# Patient Record
Sex: Female | Born: 1935 | Race: White | Hispanic: No | Marital: Single | Smoking: Former smoker
Health system: Southern US, Community
[De-identification: ages and names within clinical notes are randomized; demographics above are authoritative.]

## PROBLEM LIST (undated history)

## (undated) DIAGNOSIS — H3530 Unspecified macular degeneration: Secondary | ICD-10-CM

## (undated) DIAGNOSIS — H548 Legal blindness, as defined in USA: Secondary | ICD-10-CM

## (undated) DIAGNOSIS — M5412 Radiculopathy, cervical region: Secondary | ICD-10-CM

## (undated) DIAGNOSIS — J449 Chronic obstructive pulmonary disease, unspecified: Secondary | ICD-10-CM

## (undated) DIAGNOSIS — E1142 Type 2 diabetes mellitus with diabetic polyneuropathy: Secondary | ICD-10-CM

## (undated) DIAGNOSIS — I739 Peripheral vascular disease, unspecified: Secondary | ICD-10-CM

## (undated) DIAGNOSIS — IMO0002 Reserved for concepts with insufficient information to code with codable children: Secondary | ICD-10-CM

## (undated) DIAGNOSIS — C8307 Small cell B-cell lymphoma, spleen: Secondary | ICD-10-CM

## (undated) DIAGNOSIS — N189 Chronic kidney disease, unspecified: Secondary | ICD-10-CM

## (undated) DIAGNOSIS — N318 Other neuromuscular dysfunction of bladder: Secondary | ICD-10-CM

## (undated) DIAGNOSIS — R269 Unspecified abnormalities of gait and mobility: Secondary | ICD-10-CM

## (undated) HISTORY — DX: Small cell b-cell lymphoma, spleen: C83.07

## (undated) HISTORY — DX: Legal blindness, as defined in USA: H54.8

## (undated) HISTORY — DX: Unspecified macular degeneration: H35.30

## (undated) HISTORY — DX: Chronic kidney disease, unspecified: N18.9

## (undated) HISTORY — DX: Type 2 diabetes mellitus with diabetic polyneuropathy: E11.42

## (undated) HISTORY — DX: Other neuromuscular dysfunction of bladder: N31.8

## (undated) HISTORY — DX: Radiculopathy, cervical region: M54.12

## (undated) HISTORY — DX: Unspecified abnormalities of gait and mobility: R26.9

## (undated) HISTORY — DX: Reserved for concepts with insufficient information to code with codable children: IMO0002

## (undated) HISTORY — DX: Peripheral vascular disease, unspecified: I73.9

## (undated) HISTORY — DX: Chronic obstructive pulmonary disease, unspecified: J44.9

## (undated) HISTORY — PX: EYE SURGERY: SHX253

---

## 1998-07-22 ENCOUNTER — Ambulatory Visit (HOSPITAL_COMMUNITY): Admission: RE | Admit: 1998-07-22 | Discharge: 1998-07-22 | Payer: Self-pay | Admitting: *Deleted

## 1999-07-03 ENCOUNTER — Ambulatory Visit (HOSPITAL_COMMUNITY): Admission: RE | Admit: 1999-07-03 | Discharge: 1999-07-03 | Payer: Self-pay | Admitting: *Deleted

## 2000-07-13 ENCOUNTER — Encounter: Admission: RE | Admit: 2000-07-13 | Discharge: 2000-07-13 | Payer: Self-pay | Admitting: Internal Medicine

## 2000-07-13 ENCOUNTER — Encounter: Payer: Self-pay | Admitting: Internal Medicine

## 2000-09-23 ENCOUNTER — Encounter: Payer: Self-pay | Admitting: Orthopedic Surgery

## 2000-09-23 ENCOUNTER — Ambulatory Visit (HOSPITAL_COMMUNITY): Admission: RE | Admit: 2000-09-23 | Discharge: 2000-09-23 | Payer: Self-pay | Admitting: Orthopedic Surgery

## 2000-11-09 ENCOUNTER — Encounter: Admission: RE | Admit: 2000-11-09 | Discharge: 2000-11-09 | Payer: Self-pay | Admitting: Internal Medicine

## 2000-11-09 ENCOUNTER — Encounter: Payer: Self-pay | Admitting: Internal Medicine

## 2001-01-30 ENCOUNTER — Encounter (INDEPENDENT_AMBULATORY_CARE_PROVIDER_SITE_OTHER): Payer: Self-pay

## 2001-01-30 ENCOUNTER — Ambulatory Visit (HOSPITAL_COMMUNITY): Admission: RE | Admit: 2001-01-30 | Discharge: 2001-01-30 | Payer: Self-pay | Admitting: *Deleted

## 2001-07-13 ENCOUNTER — Ambulatory Visit (HOSPITAL_COMMUNITY): Admission: RE | Admit: 2001-07-13 | Discharge: 2001-07-13 | Payer: Self-pay | Admitting: *Deleted

## 2001-11-16 ENCOUNTER — Other Ambulatory Visit: Admission: RE | Admit: 2001-11-16 | Discharge: 2001-11-16 | Payer: Self-pay | Admitting: *Deleted

## 2002-07-04 ENCOUNTER — Encounter: Payer: Self-pay | Admitting: Internal Medicine

## 2002-07-04 ENCOUNTER — Ambulatory Visit (HOSPITAL_COMMUNITY): Admission: RE | Admit: 2002-07-04 | Discharge: 2002-07-04 | Payer: Self-pay | Admitting: Internal Medicine

## 2003-07-09 ENCOUNTER — Encounter: Payer: Self-pay | Admitting: Internal Medicine

## 2003-07-09 ENCOUNTER — Ambulatory Visit (HOSPITAL_COMMUNITY): Admission: RE | Admit: 2003-07-09 | Discharge: 2003-07-09 | Payer: Self-pay | Admitting: Internal Medicine

## 2004-04-07 ENCOUNTER — Ambulatory Visit (HOSPITAL_COMMUNITY): Admission: RE | Admit: 2004-04-07 | Discharge: 2004-04-07 | Payer: Self-pay | Admitting: *Deleted

## 2004-07-10 ENCOUNTER — Ambulatory Visit (HOSPITAL_COMMUNITY): Admission: RE | Admit: 2004-07-10 | Discharge: 2004-07-10 | Payer: Self-pay | Admitting: Internal Medicine

## 2004-09-07 ENCOUNTER — Ambulatory Visit: Payer: Self-pay | Admitting: Oncology

## 2004-10-22 ENCOUNTER — Other Ambulatory Visit: Admission: RE | Admit: 2004-10-22 | Discharge: 2004-10-22 | Payer: Self-pay | Admitting: Oncology

## 2004-10-22 ENCOUNTER — Encounter (INDEPENDENT_AMBULATORY_CARE_PROVIDER_SITE_OTHER): Payer: Self-pay | Admitting: *Deleted

## 2004-10-30 ENCOUNTER — Ambulatory Visit: Payer: Self-pay | Admitting: Oncology

## 2004-12-01 ENCOUNTER — Ambulatory Visit (HOSPITAL_COMMUNITY): Admission: RE | Admit: 2004-12-01 | Discharge: 2004-12-01 | Payer: Self-pay | Admitting: Oncology

## 2005-03-09 ENCOUNTER — Ambulatory Visit: Payer: Self-pay | Admitting: Oncology

## 2005-06-15 ENCOUNTER — Ambulatory Visit: Payer: Self-pay | Admitting: Oncology

## 2005-07-13 ENCOUNTER — Ambulatory Visit (HOSPITAL_COMMUNITY): Admission: RE | Admit: 2005-07-13 | Discharge: 2005-07-13 | Payer: Self-pay | Admitting: Internal Medicine

## 2005-09-10 ENCOUNTER — Ambulatory Visit: Payer: Self-pay | Admitting: Oncology

## 2005-12-13 ENCOUNTER — Ambulatory Visit: Payer: Self-pay | Admitting: Oncology

## 2006-03-11 ENCOUNTER — Ambulatory Visit: Payer: Self-pay | Admitting: Oncology

## 2006-03-15 LAB — COMPREHENSIVE METABOLIC PANEL
AST: 19 U/L (ref 0–37)
Albumin: 4.6 g/dL (ref 3.5–5.2)
Alkaline Phosphatase: 76 U/L (ref 39–117)
BUN: 21 mg/dL (ref 6–23)
Calcium: 9.4 mg/dL (ref 8.4–10.5)
Chloride: 103 mEq/L (ref 96–112)
Glucose, Bld: 109 mg/dL — ABNORMAL HIGH (ref 70–99)
Potassium: 4.7 mEq/L (ref 3.5–5.3)
Sodium: 141 mEq/L (ref 135–145)
Total Protein: 6.7 g/dL (ref 6.0–8.3)

## 2006-03-15 LAB — CBC & DIFF AND RETIC
Eosinophils Absolute: 0 10*3/uL (ref 0.0–0.5)
IRF: 0.42 — ABNORMAL HIGH (ref 0.130–0.330)
MONO#: 0.6 10*3/uL (ref 0.1–0.9)
NEUT#: 1.9 10*3/uL (ref 1.5–6.5)
RBC: 3.71 10*6/uL (ref 3.70–5.32)
RDW: 15.7 % — ABNORMAL HIGH (ref 11.3–14.5)
RETIC #: 95 10*3/uL (ref 19.7–115.1)
Retic %: 2.6 % — ABNORMAL HIGH (ref 0.4–2.3)
WBC: 13.2 10*3/uL — ABNORMAL HIGH (ref 3.9–10.0)
lymph#: 10.6 10*3/uL — ABNORMAL HIGH (ref 0.9–3.3)

## 2006-03-15 LAB — MORPHOLOGY

## 2006-06-13 ENCOUNTER — Ambulatory Visit: Payer: Self-pay | Admitting: Oncology

## 2006-06-15 LAB — CBC WITH DIFFERENTIAL/PLATELET
Basophils Absolute: 0.1 10*3/uL (ref 0.0–0.1)
HCT: 29.6 % — ABNORMAL LOW (ref 34.8–46.6)
HGB: 10 g/dL — ABNORMAL LOW (ref 11.6–15.9)
LYMPH%: 79.9 % — ABNORMAL HIGH (ref 14.0–48.0)
MCH: 27 pg (ref 26.0–34.0)
MONO#: 0.8 10*3/uL (ref 0.1–0.9)
NEUT%: 14.4 % — ABNORMAL LOW (ref 39.6–76.8)
Platelets: 92 10*3/uL — ABNORMAL LOW (ref 145–400)
WBC: 15.4 10*3/uL — ABNORMAL HIGH (ref 3.9–10.0)
lymph#: 12.3 10*3/uL — ABNORMAL HIGH (ref 0.9–3.3)

## 2006-06-15 LAB — MORPHOLOGY

## 2006-07-26 ENCOUNTER — Ambulatory Visit (HOSPITAL_COMMUNITY): Admission: RE | Admit: 2006-07-26 | Discharge: 2006-07-26 | Payer: Self-pay | Admitting: Pediatrics

## 2006-08-08 ENCOUNTER — Ambulatory Visit: Payer: Self-pay | Admitting: Oncology

## 2006-08-10 LAB — CBC WITH DIFFERENTIAL/PLATELET
EOS%: 0.3 % (ref 0.0–7.0)
Eosinophils Absolute: 0 10*3/uL (ref 0.0–0.5)
LYMPH%: 77.8 % — ABNORMAL HIGH (ref 14.0–48.0)
MCH: 27 pg (ref 26.0–34.0)
MCV: 80.8 fL — ABNORMAL LOW (ref 81.0–101.0)
MONO%: 4.5 % (ref 0.0–13.0)
NEUT#: 2.4 10*3/uL (ref 1.5–6.5)
Platelets: 90 10*3/uL — ABNORMAL LOW (ref 145–400)
RBC: 3.61 10*6/uL — ABNORMAL LOW (ref 3.70–5.32)
RDW: 15.5 % — ABNORMAL HIGH (ref 11.3–14.5)

## 2006-08-10 LAB — MORPHOLOGY: PLT EST: DECREASED

## 2006-10-14 ENCOUNTER — Ambulatory Visit: Payer: Self-pay | Admitting: Oncology

## 2006-10-19 LAB — CBC WITH DIFFERENTIAL/PLATELET
BASO%: 0.2 % (ref 0.0–2.0)
EOS%: 0.4 % (ref 0.0–7.0)
LYMPH%: 76.4 % — ABNORMAL HIGH (ref 14.0–48.0)
MCHC: 33.5 g/dL (ref 32.0–36.0)
MCV: 80.5 fL — ABNORMAL LOW (ref 81.0–101.0)
MONO%: 6 % (ref 0.0–13.0)
NEUT#: 2.2 10*3/uL (ref 1.5–6.5)
Platelets: 93 10*3/uL — ABNORMAL LOW (ref 145–400)
RBC: 3.87 10*6/uL (ref 3.70–5.32)
RDW: 15.1 % — ABNORMAL HIGH (ref 11.3–14.5)

## 2006-10-19 LAB — COMPREHENSIVE METABOLIC PANEL
AST: 14 U/L (ref 0–37)
Albumin: 4.8 g/dL (ref 3.5–5.2)
Alkaline Phosphatase: 67 U/L (ref 39–117)
Potassium: 4.6 mEq/L (ref 3.5–5.3)
Sodium: 141 mEq/L (ref 135–145)
Total Protein: 6.9 g/dL (ref 6.0–8.3)

## 2006-10-19 LAB — MORPHOLOGY

## 2007-01-11 ENCOUNTER — Ambulatory Visit: Payer: Self-pay | Admitting: Oncology

## 2007-01-16 LAB — CBC WITH DIFFERENTIAL/PLATELET
BASO%: 0.1 % (ref 0.0–2.0)
LYMPH%: 77.1 % — ABNORMAL HIGH (ref 14.0–48.0)
MCH: 27.1 pg (ref 26.0–34.0)
MCHC: 34 g/dL (ref 32.0–36.0)
MCV: 79.7 fL — ABNORMAL LOW (ref 81.0–101.0)
MONO%: 4.6 % (ref 0.0–13.0)
Platelets: 82 10*3/uL — ABNORMAL LOW (ref 145–400)
RBC: 3.66 10*6/uL — ABNORMAL LOW (ref 3.70–5.32)

## 2007-01-16 LAB — MORPHOLOGY: PLT EST: DECREASED

## 2007-03-10 ENCOUNTER — Ambulatory Visit: Payer: Self-pay | Admitting: Oncology

## 2007-03-14 LAB — CBC WITH DIFFERENTIAL/PLATELET
BASO%: 0 % (ref 0.0–2.0)
HCT: 29.5 % — ABNORMAL LOW (ref 34.8–46.6)
HGB: 10.1 g/dL — ABNORMAL LOW (ref 11.6–15.9)
MCHC: 34.3 g/dL (ref 32.0–36.0)
MONO#: 0.8 10*3/uL (ref 0.1–0.9)
NEUT%: 14.5 % — ABNORMAL LOW (ref 39.6–76.8)
WBC: 16.8 10*3/uL — ABNORMAL HIGH (ref 3.9–10.0)
lymph#: 13.4 10*3/uL — ABNORMAL HIGH (ref 0.9–3.3)

## 2007-03-14 LAB — MORPHOLOGY

## 2007-05-12 ENCOUNTER — Ambulatory Visit: Payer: Self-pay | Admitting: Oncology

## 2007-05-16 LAB — CBC WITH DIFFERENTIAL/PLATELET
BASO%: 0 % (ref 0.0–2.0)
EOS%: 0.5 % (ref 0.0–7.0)
MCH: 26.8 pg (ref 26.0–34.0)
MCV: 78.6 fL — ABNORMAL LOW (ref 81.0–101.0)
MONO%: 6 % (ref 0.0–13.0)
NEUT#: 2.1 10*3/uL (ref 1.5–6.5)
RBC: 3.79 10*6/uL (ref 3.70–5.32)
RDW: 15.8 % — ABNORMAL HIGH (ref 11.3–14.5)

## 2007-05-16 LAB — MORPHOLOGY

## 2007-06-30 ENCOUNTER — Ambulatory Visit: Payer: Self-pay | Admitting: Oncology

## 2007-07-04 LAB — CBC WITH DIFFERENTIAL/PLATELET
Basophils Absolute: 0 10*3/uL (ref 0.0–0.1)
EOS%: 0.3 % (ref 0.0–7.0)
MCH: 26.9 pg (ref 26.0–34.0)
MCV: 78.9 fL — ABNORMAL LOW (ref 81.0–101.0)
MONO%: 7.7 % (ref 0.0–13.0)
RBC: 3.78 10*6/uL (ref 3.70–5.32)
RDW: 15.6 % — ABNORMAL HIGH (ref 11.3–14.5)

## 2007-07-04 LAB — MORPHOLOGY

## 2007-08-01 ENCOUNTER — Ambulatory Visit (HOSPITAL_COMMUNITY): Admission: RE | Admit: 2007-08-01 | Discharge: 2007-08-01 | Payer: Self-pay | Admitting: Internal Medicine

## 2007-11-03 ENCOUNTER — Ambulatory Visit: Payer: Self-pay | Admitting: Oncology

## 2007-11-07 LAB — COMPREHENSIVE METABOLIC PANEL
ALT: 10 U/L (ref 0–35)
AST: 16 U/L (ref 0–37)
Albumin: 4.6 g/dL (ref 3.5–5.2)
Alkaline Phosphatase: 63 U/L (ref 39–117)
Potassium: 4.3 mEq/L (ref 3.5–5.3)
Sodium: 141 mEq/L (ref 135–145)
Total Bilirubin: 0.5 mg/dL (ref 0.3–1.2)
Total Protein: 6.7 g/dL (ref 6.0–8.3)

## 2007-11-07 LAB — MORPHOLOGY

## 2007-11-07 LAB — CBC WITH DIFFERENTIAL/PLATELET
BASO%: 0.1 % (ref 0.0–2.0)
Eosinophils Absolute: 0.1 10*3/uL (ref 0.0–0.5)
HCT: 28 % — ABNORMAL LOW (ref 34.8–46.6)
MCHC: 33.7 g/dL (ref 32.0–36.0)
MONO#: 0.6 10*3/uL (ref 0.1–0.9)
NEUT#: 1.7 10*3/uL (ref 1.5–6.5)
NEUT%: 13.8 % — ABNORMAL LOW (ref 39.6–76.8)
RBC: 3.55 10*6/uL — ABNORMAL LOW (ref 3.70–5.32)
WBC: 11.9 10*3/uL — ABNORMAL HIGH (ref 3.9–10.0)
lymph#: 9.6 10*3/uL — ABNORMAL HIGH (ref 0.9–3.3)

## 2007-11-07 LAB — RETICULOCYTES
RETIC #: 100.9 10*3/uL (ref 19.7–115.1)
Retic %: 2.7 % — ABNORMAL HIGH (ref 0.4–2.3)

## 2007-11-07 LAB — URIC ACID: Uric Acid, Serum: 6.4 mg/dL (ref 2.4–7.0)

## 2007-11-07 LAB — IRON AND TIBC
%SAT: 16 % — ABNORMAL LOW (ref 20–55)
TIBC: 314 ug/dL (ref 250–470)
UIBC: 265 ug/dL

## 2007-12-05 LAB — CBC WITH DIFFERENTIAL/PLATELET
BASO%: 0.2 % (ref 0.0–2.0)
LYMPH%: 79.6 % — ABNORMAL HIGH (ref 14.0–48.0)
MCHC: 34 g/dL (ref 32.0–36.0)
MONO#: 0.8 10*3/uL (ref 0.1–0.9)
NEUT#: 2 10*3/uL (ref 1.5–6.5)
Platelets: 87 10*3/uL — ABNORMAL LOW (ref 145–400)
RBC: 3.59 10*6/uL — ABNORMAL LOW (ref 3.70–5.32)
RDW: 16 % — ABNORMAL HIGH (ref 11.3–14.5)
WBC: 14.7 10*3/uL — ABNORMAL HIGH (ref 3.9–10.0)

## 2007-12-29 ENCOUNTER — Ambulatory Visit: Payer: Self-pay | Admitting: Oncology

## 2008-01-02 LAB — CBC WITH DIFFERENTIAL/PLATELET
BASO%: 0.1 % (ref 0.0–2.0)
EOS%: 0.5 % (ref 0.0–7.0)
Eosinophils Absolute: 0.1 10*3/uL (ref 0.0–0.5)
LYMPH%: 81.6 % — ABNORMAL HIGH (ref 14.0–48.0)
MCH: 26.3 pg (ref 26.0–34.0)
MCHC: 33.6 g/dL (ref 32.0–36.0)
MCV: 78.3 fL — ABNORMAL LOW (ref 81.0–101.0)
MONO%: 5.3 % (ref 0.0–13.0)
Platelets: 83 10*3/uL — ABNORMAL LOW (ref 145–400)
RBC: 3.76 10*6/uL (ref 3.70–5.32)
RDW: 16.4 % — ABNORMAL HIGH (ref 11.3–14.5)

## 2008-02-02 LAB — IRON AND TIBC
%SAT: 13 % — ABNORMAL LOW (ref 20–55)
TIBC: 356 ug/dL (ref 250–470)
UIBC: 310 ug/dL

## 2008-02-02 LAB — CBC WITH DIFFERENTIAL/PLATELET
Basophils Absolute: 0 10*3/uL (ref 0.0–0.1)
EOS%: 0.5 % (ref 0.0–7.0)
HGB: 10.4 g/dL — ABNORMAL LOW (ref 11.6–15.9)
MCH: 26.1 pg (ref 26.0–34.0)
MCV: 78.2 fL — ABNORMAL LOW (ref 81.0–101.0)
MONO%: 4.8 % (ref 0.0–13.0)
NEUT%: 19 % — ABNORMAL LOW (ref 39.6–76.8)
RDW: 16.1 % — ABNORMAL HIGH (ref 11.3–14.5)

## 2008-02-02 LAB — COMPREHENSIVE METABOLIC PANEL
AST: 17 U/L (ref 0–37)
Alkaline Phosphatase: 72 U/L (ref 39–117)
BUN: 29 mg/dL — ABNORMAL HIGH (ref 6–23)
Creatinine, Ser: 1.06 mg/dL (ref 0.40–1.20)

## 2008-04-30 ENCOUNTER — Ambulatory Visit: Payer: Self-pay | Admitting: Oncology

## 2008-05-02 LAB — CBC WITH DIFFERENTIAL/PLATELET
BASO%: 0 % (ref 0.0–2.0)
LYMPH%: 82.6 % — ABNORMAL HIGH (ref 14.0–48.0)
MCHC: 33.6 g/dL (ref 32.0–36.0)
MONO#: 0.5 10*3/uL (ref 0.1–0.9)
Platelets: 70 10*3/uL — ABNORMAL LOW (ref 145–400)
RBC: 3.86 10*6/uL (ref 3.70–5.32)
RDW: 16 % — ABNORMAL HIGH (ref 11.3–14.5)
WBC: 13.8 10*3/uL — ABNORMAL HIGH (ref 3.9–10.0)
lymph#: 11.4 10*3/uL — ABNORMAL HIGH (ref 0.9–3.3)

## 2008-05-02 LAB — MORPHOLOGY

## 2008-06-13 LAB — CBC WITH DIFFERENTIAL/PLATELET
BASO%: 0.2 % (ref 0.0–2.0)
HCT: 29.2 % — ABNORMAL LOW (ref 34.8–46.6)
HGB: 9.7 g/dL — ABNORMAL LOW (ref 11.6–15.9)
MCHC: 33.3 g/dL (ref 32.0–36.0)
MONO#: 0.7 10*3/uL (ref 0.1–0.9)
NEUT#: 1.7 10*3/uL (ref 1.5–6.5)
NEUT%: 12.4 % — ABNORMAL LOW (ref 39.6–76.8)
WBC: 13.5 10*3/uL — ABNORMAL HIGH (ref 3.9–10.0)
lymph#: 11 10*3/uL — ABNORMAL HIGH (ref 0.9–3.3)

## 2008-07-31 ENCOUNTER — Ambulatory Visit: Payer: Self-pay | Admitting: Oncology

## 2008-08-02 LAB — CBC WITH DIFFERENTIAL/PLATELET
Basophils Absolute: 0.1 10*3/uL (ref 0.0–0.1)
Eosinophils Absolute: 0.1 10*3/uL (ref 0.0–0.5)
HCT: 30.8 % — ABNORMAL LOW (ref 34.8–46.6)
LYMPH%: 77.2 % — ABNORMAL HIGH (ref 14.0–48.0)
MONO#: 0.7 10*3/uL (ref 0.1–0.9)
NEUT#: 2.7 10*3/uL (ref 1.5–6.5)
NEUT%: 17.3 % — ABNORMAL LOW (ref 39.6–76.8)
Platelets: 79 10*3/uL — ABNORMAL LOW (ref 145–400)
WBC: 15.6 10*3/uL — ABNORMAL HIGH (ref 3.9–10.0)

## 2008-08-02 LAB — MORPHOLOGY: PLT EST: DECREASED

## 2008-08-08 ENCOUNTER — Ambulatory Visit (HOSPITAL_COMMUNITY): Admission: RE | Admit: 2008-08-08 | Discharge: 2008-08-08 | Payer: Self-pay | Admitting: Internal Medicine

## 2008-09-26 ENCOUNTER — Ambulatory Visit: Payer: Self-pay | Admitting: Oncology

## 2008-10-01 LAB — CBC WITH DIFFERENTIAL/PLATELET
Basophils Absolute: 0 10*3/uL (ref 0.0–0.1)
EOS%: 0.5 % (ref 0.0–7.0)
Eosinophils Absolute: 0.1 10*3/uL (ref 0.0–0.5)
HGB: 10.4 g/dL — ABNORMAL LOW (ref 11.6–15.9)
LYMPH%: 80.3 % — ABNORMAL HIGH (ref 14.0–48.0)
MCH: 26.4 pg (ref 26.0–34.0)
MCV: 79.3 fL — ABNORMAL LOW (ref 81.0–101.0)
MONO%: 4.8 % (ref 0.0–13.0)
NEUT#: 1.9 10*3/uL (ref 1.5–6.5)
Platelets: 74 10*3/uL — ABNORMAL LOW (ref 145–400)
RBC: 3.96 10*6/uL (ref 3.70–5.32)

## 2008-11-22 ENCOUNTER — Ambulatory Visit: Payer: Self-pay | Admitting: Oncology

## 2008-11-26 LAB — CBC WITH DIFFERENTIAL/PLATELET
Basophils Absolute: 0 10*3/uL (ref 0.0–0.1)
EOS%: 0.3 % (ref 0.0–7.0)
Eosinophils Absolute: 0 10*3/uL (ref 0.0–0.5)
HCT: 31.5 % — ABNORMAL LOW (ref 34.8–46.6)
HGB: 10.4 g/dL — ABNORMAL LOW (ref 11.6–15.9)
LYMPH%: 80.3 % — ABNORMAL HIGH (ref 14.0–49.7)
MCH: 26.4 pg (ref 25.1–34.0)
MCV: 79.8 fL (ref 79.5–101.0)
MONO%: 4.8 % (ref 0.0–14.0)
NEUT%: 14.4 % — ABNORMAL LOW (ref 38.4–76.8)
Platelets: 72 10*3/uL — ABNORMAL LOW (ref 145–400)
RDW: 15.6 % — ABNORMAL HIGH (ref 11.2–14.5)

## 2008-11-26 LAB — MORPHOLOGY

## 2009-01-29 ENCOUNTER — Ambulatory Visit: Payer: Self-pay | Admitting: Oncology

## 2009-01-31 LAB — CBC WITH DIFFERENTIAL/PLATELET
BASO%: 0.4 % (ref 0.0–2.0)
Basophils Absolute: 0.1 10*3/uL (ref 0.0–0.1)
HCT: 30.4 % — ABNORMAL LOW (ref 34.8–46.6)
HGB: 10.1 g/dL — ABNORMAL LOW (ref 11.6–15.9)
MCHC: 33.2 g/dL (ref 31.5–36.0)
MONO#: 0.7 10*3/uL (ref 0.1–0.9)
NEUT%: 15.3 % — ABNORMAL LOW (ref 38.4–76.8)
WBC: 14 10*3/uL — ABNORMAL HIGH (ref 3.9–10.3)
lymph#: 11.1 10*3/uL — ABNORMAL HIGH (ref 0.9–3.3)

## 2009-01-31 LAB — COMPREHENSIVE METABOLIC PANEL
ALT: 11 U/L (ref 0–35)
AST: 16 U/L (ref 0–37)
Alkaline Phosphatase: 67 U/L (ref 39–117)
Calcium: 9.2 mg/dL (ref 8.4–10.5)
Chloride: 102 mEq/L (ref 96–112)
Creatinine, Ser: 1.06 mg/dL (ref 0.40–1.20)

## 2009-01-31 LAB — MORPHOLOGY: PLT EST: DECREASED

## 2009-03-21 ENCOUNTER — Ambulatory Visit: Payer: Self-pay | Admitting: Oncology

## 2009-03-25 LAB — CBC WITH DIFFERENTIAL/PLATELET
BASO%: 0.1 % (ref 0.0–2.0)
Basophils Absolute: 0 10*3/uL (ref 0.0–0.1)
EOS%: 0.3 % (ref 0.0–7.0)
Eosinophils Absolute: 0 10*3/uL (ref 0.0–0.5)
HCT: 29.9 % — ABNORMAL LOW (ref 34.8–46.6)
HGB: 10.1 g/dL — ABNORMAL LOW (ref 11.6–15.9)
LYMPH%: 79.6 % — ABNORMAL HIGH (ref 14.0–49.7)
MCH: 26.5 pg (ref 25.1–34.0)
MCHC: 33.6 g/dL (ref 31.5–36.0)
MCV: 78.8 fL — ABNORMAL LOW (ref 79.5–101.0)
MONO#: 0.7 10*3/uL (ref 0.1–0.9)
MONO%: 5.2 % (ref 0.0–14.0)
NEUT#: 2 10*3/uL (ref 1.5–6.5)
NEUT%: 14.8 % — ABNORMAL LOW (ref 38.4–76.8)
Platelets: 65 10*3/uL — ABNORMAL LOW (ref 145–400)
RBC: 3.8 10*6/uL (ref 3.70–5.45)
RDW: 15.9 % — ABNORMAL HIGH (ref 11.2–14.5)
WBC: 13.4 10*3/uL — ABNORMAL HIGH (ref 3.9–10.3)
lymph#: 10.7 10*3/uL — ABNORMAL HIGH (ref 0.9–3.3)

## 2009-05-16 ENCOUNTER — Ambulatory Visit: Payer: Self-pay | Admitting: Oncology

## 2009-05-20 LAB — CBC WITH DIFFERENTIAL/PLATELET
BASO%: 0 % (ref 0.0–2.0)
Basophils Absolute: 0 10*3/uL (ref 0.0–0.1)
EOS%: 0.4 % (ref 0.0–7.0)
Eosinophils Absolute: 0.1 10*3/uL (ref 0.0–0.5)
HCT: 29.6 % — ABNORMAL LOW (ref 34.8–46.6)
HGB: 9.8 g/dL — ABNORMAL LOW (ref 11.6–15.9)
LYMPH%: 81.3 % — ABNORMAL HIGH (ref 14.0–49.7)
MCH: 26.3 pg (ref 25.1–34.0)
MCHC: 33.2 g/dL (ref 31.5–36.0)
MCV: 79.2 fL — ABNORMAL LOW (ref 79.5–101.0)
MONO#: 0.7 10*3/uL (ref 0.1–0.9)
MONO%: 4.9 % (ref 0.0–14.0)
NEUT#: 1.9 10*3/uL (ref 1.5–6.5)
NEUT%: 13.4 % — ABNORMAL LOW (ref 38.4–76.8)
Platelets: 67 10*3/uL — ABNORMAL LOW (ref 145–400)
RBC: 3.74 10*6/uL (ref 3.70–5.45)
RDW: 15.9 % — ABNORMAL HIGH (ref 11.2–14.5)
WBC: 14.4 10*3/uL — ABNORMAL HIGH (ref 3.9–10.3)
lymph#: 11.7 10*3/uL — ABNORMAL HIGH (ref 0.9–3.3)

## 2009-08-01 ENCOUNTER — Ambulatory Visit: Payer: Self-pay | Admitting: Oncology

## 2009-08-05 LAB — CBC WITH DIFFERENTIAL/PLATELET
BASO%: 0.2 % (ref 0.0–2.0)
Basophils Absolute: 0 10*3/uL (ref 0.0–0.1)
EOS%: 0.3 % (ref 0.0–7.0)
Eosinophils Absolute: 0.1 10*3/uL (ref 0.0–0.5)
HCT: 29.5 % — ABNORMAL LOW (ref 34.8–46.6)
HGB: 9.7 g/dL — ABNORMAL LOW (ref 11.6–15.9)
LYMPH%: 75.2 % — ABNORMAL HIGH (ref 14.0–49.7)
MCH: 26.1 pg (ref 25.1–34.0)
MCHC: 32.8 g/dL (ref 31.5–36.0)
MCV: 79.7 fL (ref 79.5–101.0)
MONO#: 1 10*3/uL — ABNORMAL HIGH (ref 0.1–0.9)
MONO%: 5.2 % (ref 0.0–14.0)
NEUT#: 3.9 10*3/uL (ref 1.5–6.5)
NEUT%: 19.1 % — ABNORMAL LOW (ref 38.4–76.8)
Platelets: 137 10*3/uL — ABNORMAL LOW (ref 145–400)
RBC: 3.7 10*6/uL (ref 3.70–5.45)
RDW: 15.5 % — ABNORMAL HIGH (ref 11.2–14.5)
WBC: 20.2 10*3/uL — ABNORMAL HIGH (ref 3.9–10.3)
lymph#: 15.2 10*3/uL — ABNORMAL HIGH (ref 0.9–3.3)

## 2009-08-05 LAB — COMPREHENSIVE METABOLIC PANEL
ALT: 20 U/L (ref 0–35)
AST: 19 U/L (ref 0–37)
Albumin: 4.5 g/dL (ref 3.5–5.2)
Alkaline Phosphatase: 68 U/L (ref 39–117)
BUN: 25 mg/dL — ABNORMAL HIGH (ref 6–23)
CO2: 27 mEq/L (ref 19–32)
Calcium: 9.2 mg/dL (ref 8.4–10.5)
Chloride: 100 mEq/L (ref 96–112)
Creatinine, Ser: 1.13 mg/dL (ref 0.40–1.20)
Glucose, Bld: 103 mg/dL — ABNORMAL HIGH (ref 70–99)
Potassium: 4.6 mEq/L (ref 3.5–5.3)
Sodium: 139 mEq/L (ref 135–145)
Total Bilirubin: 0.5 mg/dL (ref 0.3–1.2)
Total Protein: 6.5 g/dL (ref 6.0–8.3)

## 2009-08-05 LAB — LACTATE DEHYDROGENASE: LDH: 179 U/L (ref 94–250)

## 2009-09-09 ENCOUNTER — Ambulatory Visit (HOSPITAL_COMMUNITY): Admission: RE | Admit: 2009-09-09 | Discharge: 2009-09-09 | Payer: Self-pay | Admitting: Internal Medicine

## 2010-02-02 ENCOUNTER — Ambulatory Visit: Payer: Self-pay | Admitting: Oncology

## 2010-02-03 LAB — CBC WITH DIFFERENTIAL/PLATELET
BASO%: 0 % (ref 0.0–2.0)
Basophils Absolute: 0 10*3/uL (ref 0.0–0.1)
EOS%: 0.7 % (ref 0.0–7.0)
Eosinophils Absolute: 0.1 10*3/uL (ref 0.0–0.5)
HCT: 31.3 % — ABNORMAL LOW (ref 34.8–46.6)
HGB: 10.2 g/dL — ABNORMAL LOW (ref 11.6–15.9)
LYMPH%: 79 % — ABNORMAL HIGH (ref 14.0–49.7)
MCH: 25.6 pg (ref 25.1–34.0)
MCHC: 32.5 g/dL (ref 31.5–36.0)
MCV: 78.8 fL — ABNORMAL LOW (ref 79.5–101.0)
MONO#: 0.5 10*3/uL (ref 0.1–0.9)
MONO%: 3.9 % (ref 0.0–14.0)
NEUT#: 2.2 10*3/uL (ref 1.5–6.5)
NEUT%: 16.4 % — ABNORMAL LOW (ref 38.4–76.8)
Platelets: 79 10*3/uL — ABNORMAL LOW (ref 145–400)
RBC: 3.97 10*6/uL (ref 3.70–5.45)
RDW: 15.7 % — ABNORMAL HIGH (ref 11.2–14.5)
WBC: 13.3 10*3/uL — ABNORMAL HIGH (ref 3.9–10.3)
lymph#: 10.5 10*3/uL — ABNORMAL HIGH (ref 0.9–3.3)

## 2010-02-03 LAB — MORPHOLOGY: PLT EST: DECREASED

## 2010-08-07 ENCOUNTER — Ambulatory Visit: Payer: Self-pay | Admitting: Oncology

## 2010-08-11 LAB — LACTATE DEHYDROGENASE: LDH: 191 U/L (ref 94–250)

## 2010-08-11 LAB — CBC WITH DIFFERENTIAL/PLATELET
BASO%: 0 % (ref 0.0–2.0)
Basophils Absolute: 0 10*3/uL (ref 0.0–0.1)
EOS%: 0.2 % (ref 0.0–7.0)
Eosinophils Absolute: 0 10*3/uL (ref 0.0–0.5)
HCT: 30.5 % — ABNORMAL LOW (ref 34.8–46.6)
HGB: 10 g/dL — ABNORMAL LOW (ref 11.6–15.9)
LYMPH%: 80.2 % — ABNORMAL HIGH (ref 14.0–49.7)
MCH: 25.7 pg (ref 25.1–34.0)
MCHC: 32.6 g/dL (ref 31.5–36.0)
MCV: 78.7 fL — ABNORMAL LOW (ref 79.5–101.0)
MONO#: 0.8 10*3/uL (ref 0.1–0.9)
MONO%: 5.5 % (ref 0.0–14.0)
NEUT#: 2 10*3/uL (ref 1.5–6.5)
NEUT%: 14.1 % — ABNORMAL LOW (ref 38.4–76.8)
Platelets: 74 10*3/uL — ABNORMAL LOW (ref 145–400)
RBC: 3.88 10*6/uL (ref 3.70–5.45)
RDW: 15.8 % — ABNORMAL HIGH (ref 11.2–14.5)
WBC: 14.1 10*3/uL — ABNORMAL HIGH (ref 3.9–10.3)
lymph#: 11.3 10*3/uL — ABNORMAL HIGH (ref 0.9–3.3)

## 2010-08-11 LAB — COMPREHENSIVE METABOLIC PANEL
ALT: 11 U/L (ref 0–35)
AST: 16 U/L (ref 0–37)
Albumin: 4.8 g/dL (ref 3.5–5.2)
Alkaline Phosphatase: 61 U/L (ref 39–117)
BUN: 24 mg/dL — ABNORMAL HIGH (ref 6–23)
CO2: 29 mEq/L (ref 19–32)
Calcium: 9.5 mg/dL (ref 8.4–10.5)
Chloride: 104 mEq/L (ref 96–112)
Creatinine, Ser: 1.15 mg/dL (ref 0.40–1.20)
Glucose, Bld: 101 mg/dL — ABNORMAL HIGH (ref 70–99)
Potassium: 4.6 mEq/L (ref 3.5–5.3)
Sodium: 141 mEq/L (ref 135–145)
Total Bilirubin: 0.5 mg/dL (ref 0.3–1.2)
Total Protein: 6.7 g/dL (ref 6.0–8.3)

## 2010-10-18 ENCOUNTER — Encounter: Payer: Self-pay | Admitting: Oncology

## 2011-01-29 ENCOUNTER — Other Ambulatory Visit: Payer: Self-pay | Admitting: Oncology

## 2011-01-29 ENCOUNTER — Encounter (HOSPITAL_BASED_OUTPATIENT_CLINIC_OR_DEPARTMENT_OTHER): Payer: Medicare Other | Admitting: Oncology

## 2011-01-29 DIAGNOSIS — D72820 Lymphocytosis (symptomatic): Secondary | ICD-10-CM

## 2011-01-29 DIAGNOSIS — C8589 Other specified types of non-Hodgkin lymphoma, extranodal and solid organ sites: Secondary | ICD-10-CM

## 2011-01-29 DIAGNOSIS — D72829 Elevated white blood cell count, unspecified: Secondary | ICD-10-CM

## 2011-01-29 DIAGNOSIS — R161 Splenomegaly, not elsewhere classified: Secondary | ICD-10-CM

## 2011-01-29 LAB — CBC WITH DIFFERENTIAL/PLATELET
BASO%: 0.2 % (ref 0.0–2.0)
Basophils Absolute: 0 10*3/uL (ref 0.0–0.1)
EOS%: 0.2 % (ref 0.0–7.0)
Eosinophils Absolute: 0 10*3/uL (ref 0.0–0.5)
HCT: 28.3 % — ABNORMAL LOW (ref 34.8–46.6)
HGB: 9.4 g/dL — ABNORMAL LOW (ref 11.6–15.9)
LYMPH%: 76.3 % — ABNORMAL HIGH (ref 14.0–49.7)
MCH: 26.1 pg (ref 25.1–34.0)
MCHC: 33.4 g/dL (ref 31.5–36.0)
MCV: 78.1 fL — ABNORMAL LOW (ref 79.5–101.0)
MONO#: 0.4 10*3/uL (ref 0.1–0.9)
MONO%: 5 % (ref 0.0–14.0)
NEUT#: 1.4 10*3/uL — ABNORMAL LOW (ref 1.5–6.5)
NEUT%: 18.3 % — ABNORMAL LOW (ref 38.4–76.8)
Platelets: 63 10*3/uL — ABNORMAL LOW (ref 145–400)
RBC: 3.62 10*6/uL — ABNORMAL LOW (ref 3.70–5.45)
RDW: 16.5 % — ABNORMAL HIGH (ref 11.2–14.5)
WBC: 7.7 10*3/uL (ref 3.9–10.3)
lymph#: 5.8 10*3/uL — ABNORMAL HIGH (ref 0.9–3.3)

## 2011-01-29 LAB — MORPHOLOGY: PLT EST: DECREASED

## 2011-01-29 LAB — COMPREHENSIVE METABOLIC PANEL
ALT: 11 U/L (ref 0–35)
AST: 14 U/L (ref 0–37)
Albumin: 4.7 g/dL (ref 3.5–5.2)
Alkaline Phosphatase: 61 U/L (ref 39–117)
BUN: 31 mg/dL — ABNORMAL HIGH (ref 6–23)
CO2: 26 mEq/L (ref 19–32)
Calcium: 9.1 mg/dL (ref 8.4–10.5)
Chloride: 104 mEq/L (ref 96–112)
Creatinine, Ser: 1.22 mg/dL — ABNORMAL HIGH (ref 0.40–1.20)
Glucose, Bld: 105 mg/dL — ABNORMAL HIGH (ref 70–99)
Potassium: 4.5 mEq/L (ref 3.5–5.3)
Sodium: 141 mEq/L (ref 135–145)
Total Bilirubin: 0.4 mg/dL (ref 0.3–1.2)
Total Protein: 6.6 g/dL (ref 6.0–8.3)

## 2011-01-29 LAB — LACTATE DEHYDROGENASE: LDH: 173 U/L (ref 94–250)

## 2011-01-29 LAB — URIC ACID: Uric Acid, Serum: 6.6 mg/dL (ref 2.4–7.0)

## 2011-02-12 NOTE — Op Note (Signed)
Elizabeth Holder, Elizabeth Holder                         ACCOUNT NO.:  [id number]   MEDICAL RECORD NO.:  [id number]                   PATIENT TYPE:  AMB   LOCATION:  ENDO                                 FACILITY:  [facility]   PHYSICIAN:  Georgiana Spinner, M.D.                 DATE OF BIRTH:  09-May-1917   DATE OF PROCEDURE:  04/07/2004  DATE OF DISCHARGE:                                 OPERATIVE REPORT   PROCEDURE PERFORMED:  Colonoscopy.   ENDOSCOPIST:  Georgiana Spinner, M.D.   INDICATIONS FOR PROCEDURE:  Colon polyps.   ANESTHESIA:  Demerol 75 mg, Versed 4 mg.   DESCRIPTION OF PROCEDURE:  With the patient mildly sedated in the left  lateral decubitus position, the Olympus video colonoscope was inserted in  the rectum and passed through a tortuous sigmoid colon to the cecum,  identified by the ileocecal valve and appendiceal orifice, both of which  were photographed.  From this point the colonoscope was slowly withdrawn  taking circumferential views of the colonic mucosa stopping to photograph  diverticula seen in the sigmoid colon until we reached the rectum which  appeared normal on direct and showed hemorrhoids on the retroflex view.  The  endoscope was straightened and withdrawn.  The patient's vital signs and  pulse oximeter remained stable.  The patient tolerated the procedure well  without apparent complications.   FINDINGS:  Moderately severe diverticulosis of the sigmoid colon.  Internal  hemorrhoids.  Otherwise unremarkable examination.   PLAN:  Consider repeat examination in five years.                                               Georgiana Spinner, M.D.    GMO/MEDQ  D:  04/07/2004  T:  04/07/2004  Job:  [id number]

## 2011-02-12 NOTE — Procedures (Signed)
[facility]  Patient:    Elizabeth Holder, Elizabeth Holder [facility] S Visit Number: [id number] MRN: [id number]          Service Type: END Location: ENDO Attending Physician:  Sabino Gasser Proc. Date: 01/30/01 Admit Date:  01/30/2001                             Procedure Report  PROCEDURE:  Colonoscopy.  INDICATIONS:  Rectal bleeding.  ANESTHESIA:  Demerol 60 mg, Versed 3.  DESCRIPTION OF PROCEDURE:  With the patient mildly sedated in the left lateral decubitus position, the Olympus videoscopic colonoscope was inserted into the rectum and passed under direct vision to the sigmoid colon and could not be advanced past this because of a sharp turn.  It was withdrawn, subsequently taking circumferential views of the remaining colonic mucosa.  Subsequently, the Olympus videoscopic pediatric colonoscope was inserted into the rectum, and it was passed under direct vision to the cecum.  The cecum identified by the ileocecal valve and appendiceal orifice, both of which were photographed. From this point, the colonoscope was slowly withdrawn taking circumferential views of the entire colonic mucosa, stopping to photograph significant diverticulosis seen along the way, mostly in the sigmoid and descending colon, until we reached approximately 15 cm from the anal verge at which point a small polyp was seen, photographed, and removed using snare cautery technique at a setting of 20-20 blended current.  Tissue was retrieved.  The endoscope was withdrawn all the way to the rectum which appeared normal under direct and retroflexed views.  The endoscope was straightened and withdrawn.  The patients vital signs and pulse oximeter remained stable.  The patient tolerated the procedure well without apparent complications.  FINDINGS:  Significant diverticulosis of the sigmoid colon with polyp found at approximately 15 cm from the anal verge.  Await biopsy report.  The patient will call me for  results and follow up with me as an outpatient. Attending Physician:  Sabino Gasser DD:  01/30/01 TD:  01/28/01 Job: [id number]

## 2011-03-15 ENCOUNTER — Encounter (HOSPITAL_BASED_OUTPATIENT_CLINIC_OR_DEPARTMENT_OTHER): Payer: Medicare Other | Admitting: Oncology

## 2011-03-15 ENCOUNTER — Other Ambulatory Visit: Payer: Self-pay | Admitting: Oncology

## 2011-03-15 DIAGNOSIS — D72820 Lymphocytosis (symptomatic): Secondary | ICD-10-CM

## 2011-03-15 DIAGNOSIS — D72829 Elevated white blood cell count, unspecified: Secondary | ICD-10-CM

## 2011-03-15 DIAGNOSIS — C8589 Other specified types of non-Hodgkin lymphoma, extranodal and solid organ sites: Secondary | ICD-10-CM

## 2011-03-15 DIAGNOSIS — R161 Splenomegaly, not elsewhere classified: Secondary | ICD-10-CM

## 2011-03-15 LAB — CBC WITH DIFFERENTIAL/PLATELET
BASO%: 0.1 % (ref 0.0–2.0)
Basophils Absolute: 0 10*3/uL (ref 0.0–0.1)
EOS%: 0.1 % (ref 0.0–7.0)
Eosinophils Absolute: 0 10*3/uL (ref 0.0–0.5)
HCT: 31.8 % — ABNORMAL LOW (ref 34.8–46.6)
HGB: 9.9 g/dL — ABNORMAL LOW (ref 11.6–15.9)
LYMPH%: 68.4 % — ABNORMAL HIGH (ref 14.0–49.7)
MCH: 24.1 pg — ABNORMAL LOW (ref 25.1–34.0)
MCHC: 31.1 g/dL — ABNORMAL LOW (ref 31.5–36.0)
MCV: 77.6 fL — ABNORMAL LOW (ref 79.5–101.0)
MONO#: 0.9 10*3/uL (ref 0.1–0.9)
MONO%: 10.7 % (ref 0.0–14.0)
NEUT#: 1.6 10*3/uL (ref 1.5–6.5)
NEUT%: 20.7 % — ABNORMAL LOW (ref 38.4–76.8)
Platelets: 59 10*3/uL — ABNORMAL LOW (ref 145–400)
RBC: 4.1 10*6/uL (ref 3.70–5.45)
RDW: 14.8 % — ABNORMAL HIGH (ref 11.2–14.5)
WBC: 7.9 10*3/uL (ref 3.9–10.3)
lymph#: 5.4 10*3/uL — ABNORMAL HIGH (ref 0.9–3.3)

## 2011-03-15 LAB — MORPHOLOGY: PLT EST: DECREASED

## 2011-04-14 ENCOUNTER — Other Ambulatory Visit: Payer: Self-pay | Admitting: Family Medicine

## 2011-04-14 ENCOUNTER — Ambulatory Visit
Admission: RE | Admit: 2011-04-14 | Discharge: 2011-04-14 | Disposition: A | Discharge status: Home / Self Care | Payer: No Typology Code available for payment source | Source: Ambulatory Visit | Attending: Family Medicine | Admitting: Family Medicine

## 2011-04-14 DIAGNOSIS — T148XXA Other injury of unspecified body region, initial encounter: Secondary | ICD-10-CM

## 2011-04-26 ENCOUNTER — Other Ambulatory Visit: Payer: Self-pay | Admitting: Dermatology

## 2011-07-05 ENCOUNTER — Other Ambulatory Visit: Payer: Self-pay | Admitting: Oncology

## 2011-07-05 ENCOUNTER — Encounter (HOSPITAL_BASED_OUTPATIENT_CLINIC_OR_DEPARTMENT_OTHER): Payer: PRIVATE HEALTH INSURANCE | Admitting: Oncology

## 2011-07-05 DIAGNOSIS — D72820 Lymphocytosis (symptomatic): Secondary | ICD-10-CM

## 2011-07-05 DIAGNOSIS — C8589 Other specified types of non-Hodgkin lymphoma, extranodal and solid organ sites: Secondary | ICD-10-CM

## 2011-07-05 DIAGNOSIS — R161 Splenomegaly, not elsewhere classified: Secondary | ICD-10-CM

## 2011-07-05 DIAGNOSIS — D72829 Elevated white blood cell count, unspecified: Secondary | ICD-10-CM

## 2011-07-05 LAB — CBC WITH DIFFERENTIAL/PLATELET
Eosinophils Absolute: 0 10*3/uL (ref 0.0–0.5)
HCT: 31.2 % — ABNORMAL LOW (ref 34.8–46.6)
LYMPH%: 73.2 % — ABNORMAL HIGH (ref 14.0–49.7)
MCV: 76.4 fL — ABNORMAL LOW (ref 79.5–101.0)
MONO%: 4.7 % (ref 0.0–14.0)
NEUT#: 1.6 10*3/uL (ref 1.5–6.5)
NEUT%: 21.8 % — ABNORMAL LOW (ref 38.4–76.8)
Platelets: 69 10*3/uL — ABNORMAL LOW (ref 145–400)
RBC: 4.09 10*6/uL (ref 3.70–5.45)

## 2011-07-05 LAB — COMPREHENSIVE METABOLIC PANEL
AST: 20 U/L (ref 0–37)
Albumin: 4.6 g/dL (ref 3.5–5.2)
Alkaline Phosphatase: 64 U/L (ref 39–117)
BUN: 37 mg/dL — ABNORMAL HIGH (ref 6–23)
Calcium: 9.7 mg/dL (ref 8.4–10.5)
Creatinine, Ser: 1.09 mg/dL (ref 0.50–1.10)
Glucose, Bld: 118 mg/dL — ABNORMAL HIGH (ref 70–99)
Potassium: 4.4 mEq/L (ref 3.5–5.3)

## 2011-07-05 LAB — MORPHOLOGY

## 2011-11-08 ENCOUNTER — Other Ambulatory Visit: Payer: PRIVATE HEALTH INSURANCE

## 2011-11-08 ENCOUNTER — Ambulatory Visit: Payer: PRIVATE HEALTH INSURANCE | Admitting: Nurse Practitioner

## 2011-11-26 ENCOUNTER — Telehealth: Payer: Self-pay | Admitting: Oncology

## 2011-11-26 NOTE — Telephone Encounter (Signed)
S/w pt today re coming in for f/u in march mosaiq). Per pt she doesn't think she is suppose to have an appt w/JG. Per pt the last thing she understood was that dr Modena Jansky @ [facility] would take care over her care so she wouldn't have to see both him and JG. Message sent to [facility] to confirm. Pt aware and understands i will call if she needs an appt w/JG.

## 2011-11-29 ENCOUNTER — Telehealth: Payer: Self-pay | Admitting: Oncology

## 2011-11-29 NOTE — Telephone Encounter (Signed)
Per email response from JG ok for pt to be seen/followed by [facility] the will him if they run into problems. S/w pt today she is aware. See previous note from 3/1.

## 2012-07-25 ENCOUNTER — Other Ambulatory Visit: Payer: Self-pay | Admitting: Dermatology

## 2013-04-11 ENCOUNTER — Other Ambulatory Visit: Payer: Self-pay | Admitting: Family Medicine

## 2013-04-11 DIAGNOSIS — S81801A Unspecified open wound, right lower leg, initial encounter: Secondary | ICD-10-CM

## 2013-04-18 ENCOUNTER — Ambulatory Visit
Admission: RE | Admit: 2013-04-18 | Discharge: 2013-04-18 | Disposition: A | Discharge status: Home / Self Care | Payer: PRIVATE HEALTH INSURANCE | Source: Ambulatory Visit | Attending: Family Medicine | Admitting: Family Medicine

## 2013-04-18 DIAGNOSIS — S81801A Unspecified open wound, right lower leg, initial encounter: Secondary | ICD-10-CM

## 2013-04-24 ENCOUNTER — Other Ambulatory Visit: Payer: Self-pay | Admitting: *Deleted

## 2013-04-24 ENCOUNTER — Encounter: Payer: Self-pay | Admitting: Vascular Surgery

## 2013-04-24 DIAGNOSIS — I739 Peripheral vascular disease, unspecified: Secondary | ICD-10-CM

## 2013-04-24 DIAGNOSIS — L97909 Non-pressure chronic ulcer of unspecified part of unspecified lower leg with unspecified severity: Secondary | ICD-10-CM

## 2013-04-25 ENCOUNTER — Encounter (INDEPENDENT_AMBULATORY_CARE_PROVIDER_SITE_OTHER): Payer: PRIVATE HEALTH INSURANCE | Admitting: Vascular Surgery

## 2013-04-25 ENCOUNTER — Ambulatory Visit (INDEPENDENT_AMBULATORY_CARE_PROVIDER_SITE_OTHER): Payer: PRIVATE HEALTH INSURANCE | Admitting: Vascular Surgery

## 2013-04-25 ENCOUNTER — Encounter: Payer: Self-pay | Admitting: Vascular Surgery

## 2013-04-25 VITALS — BP 175/81 | HR 81 | Resp 16 | Ht 63.0 in | Wt 121.0 lb

## 2013-04-25 DIAGNOSIS — L97909 Non-pressure chronic ulcer of unspecified part of unspecified lower leg with unspecified severity: Secondary | ICD-10-CM

## 2013-04-25 DIAGNOSIS — I739 Peripheral vascular disease, unspecified: Secondary | ICD-10-CM

## 2013-04-25 NOTE — Progress Notes (Signed)
[facility] [place]  Referred by:  Jethro Bastos, MD [address] [place], [place] [address]  MD referral: Dr.  Marny Lowenstein  History of Present Illness  Elizabeth Holder is a 79 y.o. (1917/08/09) female who presents with chief complaint: right leg pain with ulcer.  Onset of symptom occurred June 14th 2014.  Pain is described as tighness, severity depends on swelling, and associated with ambulating.  Patient has attempted to treat this pain with wound care and rest with elevation.  The patient has no rest pain symptoms also and right leg wounds/ulcers, yellow eschar described 1.5 cm in diameter.  The wound has made no progress with healing over the last 6 weeks. Atherosclerotic risk factors include: history of hypercholesterolemia and hypertension as well as age. She is a former smoker but quit over 20 years ago.  She currently does not take medication such as satins or beta blockers. Chronic medical problems include stage III kidney dysfunction, macular degeneration legally blind, COPD, overall deconditioning. These are all currently stable.  Past Medical History  Diagnosis Date  . Peripheral arterial disease   . Ulcer     Non-healing ischemic ulcer RIGHT LEG  . Marginal zone lymphoma, spleen   . Macular degeneration (senile) of retina, unspecified   . Legal blindness, as defined in [place]   . Polyneuropathy in diabetes(357.2)   . Brachial neuritis or radiculitis NOS   . COPD (chronic obstructive pulmonary disease)   . Chronic kidney disease     stage III  . Abnormality of gait   . Hypertonicity of bladder     Past Surgical History  Procedure Laterality Date  . Eye surgery Bilateral     cataract    History   Social History  . Marital Status: Single    Spouse Name: N/A    Number of Children: N/A  . Years of Education: N/A   Occupational History  . Not on file.   Social History Main Topics  . Smoking status: Former Smoker    Quit date: 04/25/1993   . Smokeless tobacco: Never Used  . Alcohol Use: Yes  . Drug Use: No  . Sexually Active: Not on file   Other Topics Concern  . Not on file   Social History Narrative  . No narrative on file   see above tobacco history  Family History  Problem Relation Age of Onset  . Other Mother     macular degeneration  . Other Father     Current Outpatient Prescriptions on File Prior to Visit  Medication Sig Dispense Refill  . acetaminophen (TYLENOL) 325 MG tablet Take 650 mg by mouth every 6 (six) hours as needed for pain.      . Calcium Carbonate-Vit D-Min 600-400 MG-UNIT TABS Take 1 tablet by mouth daily.      Marland Kitchen ENSURE PLUS (ENSURE PLUS) LIQD Take 237 mLs by mouth 3 (three) times daily between meals.      . gabapentin (NEURONTIN) 400 MG capsule Take 400 mg by mouth 2 (two) times daily.      . Multiple Vitamins-Minerals (MULTIVITAMIN WITH MINERALS) tablet Take 1 tablet by mouth daily.      . mupirocin cream (BACTROBAN) 2 % Apply topically 2 (two) times daily.       No current facility-administered medications on file prior to visit.    Allergies  Allergen Reactions  . Augmentin (Amoxicillin-Pot Clavulanate)     REVIEW OF SYSTEMS:  (Positives checked otherwise negative)  CARDIOVASCULAR:  [ ]  chest pain, [ ]  chest pressure, [ ]  palpitations, [ ]  shortness of breath when laying flat, [x ] shortness of breath with exertion,   [ ]  pain in feet when walking, [ ]  pain in feet when laying flat, [ ]  history of blood clot in veins (DVT), [ ]  history of phlebitis, [x ] swelling in legs, [ ]  varicose veins  PULMONARY:  [ ]  productive cough, [ ]  asthma, [ ]  wheezing  NEUROLOGIC:  [x ] weakness in arms or legs, [ ]  numbness in arms or legs, [ ]  difficulty speaking or slurred speech, [ ]  temporary loss of vision in one eye, [ ]  dizziness [x]  macular degeneration HEMATOLOGIC:  [ ]  bleeding problems, [ ]  problems with blood clotting too easily  MUSCULOSKEL:  [x ] joint pain, [ ]  joint  swelling  GASTROINTEST:  [ ]   Vomiting blood, [ ]   Blood in stool     GENITOURINARY:  [ ]   Burning with urination, [ ]   Blood in urine [x]  incontinous  PSYCHIATRIC:  [ ]  history of major depression  INTEGUMENTARY:  [ ]  rashes, [x ] ulcers  CONSTITUTIONAL:  [ ]  fever, [ ]  chills  For VQI Use Only    PRE-ADM LIVING: Home  AMB STATUS: Ambulatory with Assistance  CAD Sx: None  PRIOR CHF: None  STRESS TEST: [x ] No, [ ]  Normal, [ ]  + ischemia, [ ]  + MI, [ ]  Both    Physical Examination Filed Vitals:   04/25/13 1413  BP: 175/81  Pulse: 81  Resp: 16  Height: 5\' 3"  (1.6 m)  Weight: 121 lb (54.885 kg)   Body mass index is 21.44 kg/(m^2).  General: A&O x 3, WDWN  Head:normocephalic  Ear/Nose/Throat: Hearing grossly intact, nares w/o erythema or drainage, oropharynx w/o Erythema/Exudate, Hearing loss, Nasal drainage, Dental caries, OP w/ erythema / exudate  Eyes: PERRLA, EOMI Neck: Supple, palpable carotid pulses Pulmonary: Sym exp, good air movt, CTAB, no rales, rhonchi, & wheezing,   Cardiac: irregular with soft murmur Vascular: Vessel Right Left  Radial  Palpable  Palpable  Ulnar  Palpable  Palpable  Brachial  Palpable  Palpable  Carotid  Palpable, without bruit  Palpable, without bruit  Aorta  Not palpable N/A  Femoral  Palpable  Palpable  Popliteal  Not palpable  Not palpable  PT not Palpable not Palpable  DP not Palpable not Palpable   Gastrointestinal: soft, NTTP Musculoskeletal: M/S 5/5 throughout, Extremities without ischemic changes   Neurologic: CN 2-12 intact, Pain and light touch intact in extremities, Motor exam as listed above  Psychiatric: Judgment intact, Mood & affect appropriatefor pt's clinical situation  Dermatologic: See M/S exam for extremity exam, no rashes otherwise noted Right lower leg yellow eschar wound 1.5 cm. less than 1 mm depth Erythema mid lower leg into foot.  No malodor.  Lymph : No Cervical, Axillary, or Inguinal  lymphadenopathy    Non-Invasive Vascular Imaging  ABI (Date: 04/18/2013)  R: ABI 0.37 Left ABI 0.61  Arterial duplex shows multilevel arterial disease.  Medical Decision Making  Elizabeth Holder is a 79 y.o. female who presents with:  None healing right lower extremity wound. Chronic arterial disease multilevel bilateral LE We will set her up for aortogram with bilateral leg runoff and possible intervention with Dr. Darrick Penna Friday 04/27/2013.   [facility] [place] Office: [phone] Pager: [phone]  04/25/2013, 2:25 PM    History and exam details as above. Chronic  right lower extremity wound with poor perfusion right lower extremity as described above. I reviewed and interpreted her arterial duplex exam today. This showed multilevel superficial femoral and tibial artery occlusive disease bilaterally. ABIs dated July 23 from [facility] imaging were reviewed which showed an ABI on the right a 0.63 left 0.35.  The patient is scheduled for arteriogram possible intervention on 04/27/2013. Due to her age she would be much better served with a percutaneous intervention if possible. However the appearance of the duplex scan suggests that she may have multilevel disease that may require operative intervention for wound healing. We will determine this based on the arteriogram findings. Risks benefits possible complications and procedure details of arteriography and possible intervention were explained the patient today including but not limited to bleeding infection vessel injury. I also discussed with her that she does not have completely normal kidney function and I would check her serum creatinine and hydrate her at a time to prevent and reduce risk of contrast nephropathy. However if her creatinine is significantly elevated we may need dissecting yes whether or not to proceed with arteriography or more and save further trial of observation. She will be high risk either way  due to her advanced age.  Fabienne Bruns, MD [facility] [place] Office: [phone] Pager: [phone]

## 2013-04-26 ENCOUNTER — Encounter (HOSPITAL_COMMUNITY): Payer: Self-pay | Admitting: Pharmacy Technician

## 2013-04-26 ENCOUNTER — Other Ambulatory Visit: Payer: Self-pay

## 2013-04-27 ENCOUNTER — Other Ambulatory Visit: Payer: Self-pay | Admitting: *Deleted

## 2013-04-27 ENCOUNTER — Ambulatory Visit (HOSPITAL_COMMUNITY)
Admission: RE | Admit: 2013-04-27 | Discharge: 2013-04-27 | Disposition: A | Discharge status: Home / Self Care | Payer: Medicare (Managed Care) | Source: Ambulatory Visit | Attending: Vascular Surgery | Admitting: Vascular Surgery

## 2013-04-27 ENCOUNTER — Encounter (HOSPITAL_COMMUNITY)
Admission: RE | Disposition: A | Discharge status: Home / Self Care | Payer: Self-pay | Source: Ambulatory Visit | Attending: Vascular Surgery

## 2013-04-27 DIAGNOSIS — C8307 Small cell B-cell lymphoma, spleen: Secondary | ICD-10-CM | POA: Insufficient documentation

## 2013-04-27 DIAGNOSIS — E1142 Type 2 diabetes mellitus with diabetic polyneuropathy: Secondary | ICD-10-CM | POA: Insufficient documentation

## 2013-04-27 DIAGNOSIS — Z87891 Personal history of nicotine dependence: Secondary | ICD-10-CM | POA: Insufficient documentation

## 2013-04-27 DIAGNOSIS — J449 Chronic obstructive pulmonary disease, unspecified: Secondary | ICD-10-CM | POA: Insufficient documentation

## 2013-04-27 DIAGNOSIS — L97909 Non-pressure chronic ulcer of unspecified part of unspecified lower leg with unspecified severity: Secondary | ICD-10-CM | POA: Insufficient documentation

## 2013-04-27 DIAGNOSIS — I771 Stricture of artery: Secondary | ICD-10-CM | POA: Insufficient documentation

## 2013-04-27 DIAGNOSIS — Z79899 Other long term (current) drug therapy: Secondary | ICD-10-CM | POA: Insufficient documentation

## 2013-04-27 DIAGNOSIS — H548 Legal blindness, as defined in USA: Secondary | ICD-10-CM | POA: Insufficient documentation

## 2013-04-27 DIAGNOSIS — N183 Chronic kidney disease, stage 3 unspecified: Secondary | ICD-10-CM | POA: Insufficient documentation

## 2013-04-27 DIAGNOSIS — J4489 Other specified chronic obstructive pulmonary disease: Secondary | ICD-10-CM | POA: Insufficient documentation

## 2013-04-27 DIAGNOSIS — M5412 Radiculopathy, cervical region: Secondary | ICD-10-CM | POA: Insufficient documentation

## 2013-04-27 DIAGNOSIS — Z880 Allergy status to penicillin: Secondary | ICD-10-CM | POA: Insufficient documentation

## 2013-04-27 DIAGNOSIS — E7800 Pure hypercholesterolemia, unspecified: Secondary | ICD-10-CM | POA: Insufficient documentation

## 2013-04-27 DIAGNOSIS — N318 Other neuromuscular dysfunction of bladder: Secondary | ICD-10-CM | POA: Insufficient documentation

## 2013-04-27 DIAGNOSIS — L98499 Non-pressure chronic ulcer of skin of other sites with unspecified severity: Secondary | ICD-10-CM

## 2013-04-27 DIAGNOSIS — E1149 Type 2 diabetes mellitus with other diabetic neurological complication: Secondary | ICD-10-CM | POA: Insufficient documentation

## 2013-04-27 DIAGNOSIS — I739 Peripheral vascular disease, unspecified: Secondary | ICD-10-CM

## 2013-04-27 DIAGNOSIS — I129 Hypertensive chronic kidney disease with stage 1 through stage 4 chronic kidney disease, or unspecified chronic kidney disease: Secondary | ICD-10-CM | POA: Insufficient documentation

## 2013-04-27 HISTORY — PX: ABDOMINAL AORTAGRAM: SHX5454

## 2013-04-27 LAB — POCT I-STAT, CHEM 8
Chloride: 100 mEq/L (ref 96–112)
Creatinine, Ser: 1.4 mg/dL — ABNORMAL HIGH (ref 0.50–1.10)
Glucose, Bld: 113 mg/dL — ABNORMAL HIGH (ref 70–99)
Potassium: 4.3 mEq/L (ref 3.5–5.1)

## 2013-04-27 SURGERY — ABDOMINAL AORTAGRAM
Anesthesia: LOCAL | Laterality: Right

## 2013-04-27 MED ORDER — SODIUM CHLORIDE 0.9 % IV SOLN
INTRAVENOUS | Status: DC
  Administered 2013-04-27: 09:00:00 via INTRAVENOUS

## 2013-04-27 MED ORDER — METOPROLOL TARTRATE 1 MG/ML IV SOLN: 2.0000 mg | INTRAVENOUS | Status: DC | PRN

## 2013-04-27 MED ORDER — ONDANSETRON HCL 4 MG/2ML IJ SOLN: 4.0000 mg | Freq: Four times a day (QID) | INTRAMUSCULAR | Status: DC | PRN

## 2013-04-27 MED ORDER — ACETAMINOPHEN 325 MG RE SUPP
325.0000 mg | RECTAL | Status: DC | PRN
  Filled 2013-04-27: qty 2

## 2013-04-27 MED ORDER — HYDRALAZINE HCL 20 MG/ML IJ SOLN: 10.0000 mg | INTRAMUSCULAR | Status: DC | PRN

## 2013-04-27 MED ORDER — SODIUM CHLORIDE 0.45 % IV SOLN
INTRAVENOUS | Status: DC
  Administered 2013-04-27: 14:00:00 via INTRAVENOUS

## 2013-04-27 MED ORDER — LABETALOL HCL 5 MG/ML IV SOLN: 10.0000 mg | INTRAVENOUS | Status: DC | PRN

## 2013-04-27 MED ORDER — MORPHINE SULFATE 10 MG/ML IJ SOLN: 2.0000 mg | INTRAMUSCULAR | Status: DC | PRN

## 2013-04-27 MED ORDER — LIDOCAINE HCL (PF) 1 % IJ SOLN
INTRAMUSCULAR | Status: AC
  Filled 2013-04-27: qty 30

## 2013-04-27 MED ORDER — ACETAMINOPHEN 325 MG PO TABS
325.0000 mg | ORAL_TABLET | ORAL | Status: DC | PRN
  Filled 2013-04-27: qty 2

## 2013-04-27 MED ORDER — HEPARIN (PORCINE) IN NACL 2-0.9 UNIT/ML-% IJ SOLN
INTRAMUSCULAR | Status: AC
  Filled 2013-04-27: qty 1000

## 2013-04-27 MED ORDER — GUAIFENESIN-DM 100-10 MG/5ML PO SYRP
15.0000 mL | ORAL_SOLUTION | ORAL | Status: DC | PRN
  Filled 2013-04-27: qty 15

## 2013-04-27 MED ORDER — PHENOL 1.4 % MT LIQD: 1.0000 | OROMUCOSAL | Status: DC | PRN

## 2013-04-27 MED ORDER — HYDRALAZINE HCL 20 MG/ML IJ SOLN
INTRAMUSCULAR | Status: AC
  Filled 2013-04-27: qty 1

## 2013-04-27 NOTE — H&P (View-Only) (Signed)
[facility] [place]  Referred by:  Robert N Koehler, MD [address] [place], [place] [address]  MD referral: Dr.  Robert Koehler  History of Present Illness  Elizabeth Holder is a 79 y.o. (02/13/1917) female who presents with chief complaint: right leg pain with ulcer.  Onset of symptom occurred June 14th 2014.  Pain is described as tighness, severity depends on swelling, and associated with ambulating.  Patient has attempted to treat this pain with wound care and rest with elevation.  The patient has no rest pain symptoms also and right leg wounds/ulcers, yellow eschar described 1.5 cm in diameter.  The wound has made no progress with healing over the last 6 weeks. Atherosclerotic risk factors include: history of hypercholesterolemia and hypertension as well as age. She is a former smoker but quit over 20 years ago.  She currently does not take medication such as satins or beta blockers. Chronic medical problems include stage III kidney dysfunction, macular degeneration legally blind, COPD, overall deconditioning. These are all currently stable.  Past Medical History  Diagnosis Date  . Peripheral arterial disease   . Ulcer     Non-healing ischemic ulcer RIGHT LEG  . Marginal zone lymphoma, spleen   . Macular degeneration (senile) of retina, unspecified   . Legal blindness, as defined in [place]   . Polyneuropathy in diabetes(357.2)   . Brachial neuritis or radiculitis NOS   . COPD (chronic obstructive pulmonary disease)   . Chronic kidney disease     stage III  . Abnormality of gait   . Hypertonicity of bladder     Past Surgical History  Procedure Laterality Date  . Eye surgery Bilateral     cataract    History   Social History  . Marital Status: Single    Spouse Name: N/A    Number of Children: N/A  . Years of Education: N/A   Occupational History  . Not on file.   Social History Main Topics  . Smoking status: Former Smoker    Quit date: 04/25/1993   . Smokeless tobacco: Never Used  . Alcohol Use: Yes  . Drug Use: No  . Sexually Active: Not on file   Other Topics Concern  . Not on file   Social History Narrative  . No narrative on file   see above tobacco history  Family History  Problem Relation Age of Onset  . Other Mother     macular degeneration  . Other Father     Current Outpatient Prescriptions on File Prior to Visit  Medication Sig Dispense Refill  . acetaminophen (TYLENOL) 325 MG tablet Take 650 mg by mouth every 6 (six) hours as needed for pain.      . Calcium Carbonate-Vit D-Min 600-400 MG-UNIT TABS Take 1 tablet by mouth daily.      . ENSURE PLUS (ENSURE PLUS) LIQD Take 237 mLs by mouth 3 (three) times daily between meals.      . gabapentin (NEURONTIN) 400 MG capsule Take 400 mg by mouth 2 (two) times daily.      . Multiple Vitamins-Minerals (MULTIVITAMIN WITH MINERALS) tablet Take 1 tablet by mouth daily.      . mupirocin cream (BACTROBAN) 2 % Apply topically 2 (two) times daily.       No current facility-administered medications on file prior to visit.    Allergies  Allergen Reactions  . Augmentin (Amoxicillin-Pot Clavulanate)     REVIEW OF SYSTEMS:  (Positives checked otherwise negative)    CARDIOVASCULAR:  [ ] chest pain, [ ] chest pressure, [ ] palpitations, [ ] shortness of breath when laying flat, [x ] shortness of breath with exertion,   [ ] pain in feet when walking, [ ] pain in feet when laying flat, [ ] history of blood clot in veins (DVT), [ ] history of phlebitis, [x ] swelling in legs, [ ] varicose veins  PULMONARY:  [ ] productive cough, [ ] asthma, [ ] wheezing  NEUROLOGIC:  [x ] weakness in arms or legs, [ ] numbness in arms or legs, [ ] difficulty speaking or slurred speech, [ ] temporary loss of vision in one eye, [ ] dizziness [x] macular degeneration HEMATOLOGIC:  [ ] bleeding problems, [ ] problems with blood clotting too easily  MUSCULOSKEL:  [x ] joint pain, [ ] joint  swelling  GASTROINTEST:  [ ]  Vomiting blood, [ ]  Blood in stool     GENITOURINARY:  [ ]  Burning with urination, [ ]  Blood in urine [x] incontinous  PSYCHIATRIC:  [ ] history of major depression  INTEGUMENTARY:  [ ] rashes, [x ] ulcers  CONSTITUTIONAL:  [ ] fever, [ ] chills  For VQI Use Only    PRE-ADM LIVING: Home  AMB STATUS: Ambulatory with Assistance  CAD Sx: None  PRIOR CHF: None  STRESS TEST: [x ] No, [ ] Normal, [ ] + ischemia, [ ] + MI, [ ] Both    Physical Examination Filed Vitals:   04/25/13 1413  BP: 175/81  Pulse: 81  Resp: 16  Height: 5' 3" (1.6 m)  Weight: 121 lb (54.885 kg)   Body mass index is 21.44 kg/(m^2).  General: A&O x 3, WDWN  Head:normocephalic  Ear/Nose/Throat: Hearing grossly intact, nares w/o erythema or drainage, oropharynx w/o Erythema/Exudate, Hearing loss, Nasal drainage, Dental caries, OP w/ erythema / exudate  Eyes: PERRLA, EOMI Neck: Supple, palpable carotid pulses Pulmonary: Sym exp, good air movt, CTAB, no rales, rhonchi, & wheezing,   Cardiac: irregular with soft murmur Vascular: Vessel Right Left  Radial  Palpable  Palpable  Ulnar  Palpable  Palpable  Brachial  Palpable  Palpable  Carotid  Palpable, without bruit  Palpable, without bruit  Aorta  Not palpable N/A  Femoral  Palpable  Palpable  Popliteal  Not palpable  Not palpable  PT not Palpable not Palpable  DP not Palpable not Palpable   Gastrointestinal: soft, NTTP Musculoskeletal: M/S 5/5 throughout, Extremities without ischemic changes   Neurologic: CN 2-12 intact, Pain and light touch intact in extremities, Motor exam as listed above  Psychiatric: Judgment intact, Mood & affect appropriatefor pt's clinical situation  Dermatologic: See M/S exam for extremity exam, no rashes otherwise noted Right lower leg yellow eschar wound 1.5 cm. less than 1 mm depth Erythema mid lower leg into foot.  No malodor.  Lymph : No Cervical, Axillary, or Inguinal  lymphadenopathy    Non-Invasive Vascular Imaging  ABI (Date: 04/18/2013)  R: ABI 0.37 Left ABI 0.61  Arterial duplex shows multilevel arterial disease.  Medical Decision Making  Elizabeth Holder is a 79 y.o. female who presents with:  None healing right lower extremity wound. Chronic arterial disease multilevel bilateral LE We will set her up for aortogram with bilateral leg runoff and possible intervention with Dr. Fields Friday 04/27/2013.   [facility] [place] Office: [phone] Pager: [phone]  04/25/2013, 2:25 PM    History and exam details as above. Chronic   right lower extremity wound with poor perfusion right lower extremity as described above. I reviewed and interpreted her arterial duplex exam today. This showed multilevel superficial femoral and tibial artery occlusive disease bilaterally. ABIs dated July 23 from [place] imaging were reviewed which showed an ABI on the right a 0.63 left 0.35.  The patient is scheduled for arteriogram possible intervention on 04/27/2013. Due to her age she would be much better served with a percutaneous intervention if possible. However the appearance of the duplex scan suggests that she may have multilevel disease that may require operative intervention for wound healing. We will determine this based on the arteriogram findings. Risks benefits possible complications and procedure details of arteriography and possible intervention were explained the patient today including but not limited to bleeding infection vessel injury. I also discussed with her that she does not have completely normal kidney function and I would check her serum creatinine and hydrate her at a time to prevent and reduce risk of contrast nephropathy. However if her creatinine is significantly elevated we may need dissecting yes whether or not to proceed with arteriography or more and save further trial of observation. She will be high risk either way  due to her advanced age.  Charles Fields, MD [facility] [place] Office: [phone] Pager: [phone]   

## 2013-04-27 NOTE — Interval H&P Note (Signed)
History and Physical Interval Note:  04/27/2013 1:02 PM  Elizabeth Holder  has presented today for surgery, with the diagnosis of PVD  The various methods of treatment have been discussed with the patient and family. After consideration of risks, benefits and other options for treatment, the patient has consented to  Procedure(s): ABDOMINAL AORTAGRAM (N/A) as a surgical intervention .  The patient's history has been reviewed, patient examined, no change in status, stable for surgery.  I have reviewed the patient's chart and labs.  Questions were answered to the patient's satisfaction.     FIELDS,CHARLES E

## 2013-04-27 NOTE — Interval H&P Note (Signed)
History and Physical Interval Note:  04/27/2013 11:51 AM  Elizabeth Holder  has presented today for surgery, with the diagnosis of PVD  The various methods of treatment have been discussed with the patient and family. After consideration of risks, benefits and other options for treatment, the patient has consented to  Procedure(s): ABDOMINAL AORTAGRAM (N/A) as a surgical intervention .  The patient's history has been reviewed, patient examined, no change in status, stable for surgery.  I have reviewed the patient's chart and labs.  Questions were answered to the patient's satisfaction.     Issak Goley E

## 2013-04-27 NOTE — Progress Notes (Signed)
1000 cc bolus completed, NS infusing at 100.  Will continue to monitor

## 2013-04-27 NOTE — Interval H&P Note (Signed)
History and Physical Interval Note:  04/27/2013 1:01 PM  Elizabeth Holder  has presented today for surgery, with the diagnosis of PVD  The various methods of treatment have been discussed with the patient and family. After consideration of risks, benefits and other options for treatment, the patient has consented to  Procedure(s): ABDOMINAL AORTAGRAM (N/A) as a surgical intervention .  The patient's history has been reviewed, patient examined, no change in status, stable for surgery.  I have reviewed the patient's chart and labs.  Questions were answered to the patient's satisfaction.     FIELDS,CHARLES E

## 2013-04-27 NOTE — Op Note (Signed)
      [facility]           OF [place]   Procedure: Aortogram with right lower extremity runoff  Preoperative diagnosis: Right leg ulcer  Postoperative diagnosis: Same  Anesthesia Local  Operative details: After obtaining informed consent, the patient was taken to the [facility] LAB. The patient was placed in supine position on the Angio table. Both groins were prepped and draped in usual sterile fashion. Local anesthesia was infiltrated over the left common femoral artery. Initially, ultrasound was used to identify the common femoral artery   An introducer needle was used to cannulate the left common femoral artery and 035 versacore wire threaded into the abdominal aorta under fluoroscopic guidance. Next a 5 French sheath is placed over the guidewire in the left common femoral artery. A 5 French pigtail catheter was placed over the guidewire into the abdominal aorta and abdominal aortogram was obtained. There infrarenal abdominal aorta is patent. It is slightly tortuous. The left and right renal arteries are patent. The left and right common external and internal iliac arteries are patent.   In order to get increased opacification of the tibials a 5 Fr crossover catheter was brought up on the field.  An attempt was made to use a 5 [place] crossover catheter to selectively catheterize the right common iliac artery. However this was unsuccessful due to tortuosity of the iliacs. At this point the crossover catheter was removed and exchanged for a 5 [place] SOS catheter. The SOS catheter was used to selectively catheterize the right common iliac artery and the guidewire advanced into the right distal external iliac artery. The crossover catheter was removed and replaced with a 5 French straight catheter. Angiogram was then performed the right lower extremity. The right common femoral artery is patent. The right profunda femoris artery is patent. The right superficial femoral artery is patent  but is diffusely diseased with multiple segments of 70-90% stenosis. There is no area that is focally stenotic. The entire superficial femoral artery is diffusely diseased. The popliteal artery is patent. The anterior tibial artery is patent and is the single runoff vessel to the foot.  Next the 5 French straight catheter was removed over a guidewire.   The 5 Fr sheath was left in place to be pulled in the holding area. The patient tolerated the procedure well and there were no complications. Patient was taken to the holding area in stable condition.  Operative findings: Diffusely diseased right superficial femoral artery but patent one-vessel runoff via anterior tibial artery  Operative management: Patient should have adequate perfusion to heal the superficial ulceration on her right pretibial area. This is most likely venous stasis ulcer. I believe she would tolerate the therapy at this point. We'll have her scheduled to start this next week. If the wound continues to deteriorate we would consider angioplasty and stenting of the entire right superficial femoral artery.  Fabienne Bruns, MD [facility] of [place] Office: [phone] Pager: [phone]

## 2013-04-30 ENCOUNTER — Telehealth: Payer: Self-pay | Admitting: Vascular Surgery

## 2013-04-30 NOTE — Telephone Encounter (Signed)
Aortogram with right leg runoff Ultrasound  She needs to start unna boots  Right leg early next week can come at her convenience then once weekly and see me in a month  Charles  ----  04/30/2013: spoke with pt- she asked that I call [facility] to set up. I have left a message for Sonya to call back for appt info. dm

## 2013-05-01 ENCOUNTER — Telehealth: Payer: Self-pay | Admitting: *Deleted

## 2013-05-01 ENCOUNTER — Encounter: Payer: Self-pay | Admitting: *Deleted

## 2013-05-01 NOTE — Telephone Encounter (Signed)
Message copied by Melene Plan on Tue May 01, 2013  3:55 PM ------      Message from: Sherren Kerns      Created: Fri Apr 27, 2013  1:14 PM       Aortogram with right leg runoff      Ultrasound            She needs to start unna boots  Right leg early next week can come at her convenience then once weekly and see me in a month            Charles ------

## 2013-05-01 NOTE — Telephone Encounter (Signed)
Mariann from [phone] called to request an order & office note for [employer]. Elvera Maria faxed both to [phone] attn:Jackie

## 2013-05-30 ENCOUNTER — Encounter: Payer: Self-pay | Admitting: Vascular Surgery

## 2013-05-31 ENCOUNTER — Ambulatory Visit (INDEPENDENT_AMBULATORY_CARE_PROVIDER_SITE_OTHER): Payer: PRIVATE HEALTH INSURANCE | Admitting: Vascular Surgery

## 2013-05-31 ENCOUNTER — Encounter: Payer: Self-pay | Admitting: Vascular Surgery

## 2013-05-31 VITALS — BP 155/66 | HR 76 | Ht 63.0 in | Wt 119.6 lb

## 2013-05-31 DIAGNOSIS — I7025 Atherosclerosis of native arteries of other extremities with ulceration: Secondary | ICD-10-CM | POA: Insufficient documentation

## 2013-05-31 DIAGNOSIS — I739 Peripheral vascular disease, unspecified: Secondary | ICD-10-CM

## 2013-05-31 NOTE — Progress Notes (Signed)
[facility] OF [place]  Referred by:   Jethro Bastos, MD [address] [place], [place] [address]  History of Present Illness  Elizabeth Holder is a 79 y.o. ([birth year]) female who presents with chief complaint: right leg pain with ulcer.  Onset of symptom occurred June 14th 2014.  Pain is described as tighness, severity depends on swelling, and associated with ambulating.  Patient has attempted to treat this pain with wound care and rest with elevation.  she has been receiving 4-layer dressing/inability therapy for one month. She returns today for followup. She denies any pain in the ulcer. Recent arteriogram showed one-vessel runoff via the anterior tibial artery with diffuse right superficial femoral artery segments of 70-90% stenosis Atherosclerotic risk factors include: history of hypercholesterolemia and hypertension as well as age. She is a former smoker but quit over 20 years ago.  She currently does not take medication such as satins or beta blockers. Chronic medical problems include stage III kidney dysfunction, macular degeneration legally blind, COPD, overall deconditioning. These are all currently stable.      REVIEW OF SYSTEMS:  (Positives checked otherwise negative)  CARDIOVASCULAR:  [ ]  chest pain, [ ]  chest pressure, [ ]  palpitations, [ ]  shortness of breath when laying flat, [x ] shortness of breath with exertion,   [ ]  pain in feet when walking, [ ]  pain in feet when laying flat, [ ]  history of blood clot in veins (DVT), [ ]  history of phlebitis, [x ] swelling in legs, [ ]  varicose veins  PULMONARY:  [ ]  productive cough, [ ]  asthma, [ ]  wheezing     Physical Examination  Filed Vitals:   05/31/13 1010  BP: 155/66  Pulse: 76  Height: 5\' 3"  (1.6 m)  Weight: 119 lb 9.6 oz (54.25 kg)  SpO2: 99%    General: A&O x 3, WDWN  Cardiac: irregular with soft murmur Vascular: Vessel  Right  Left   Radial   Palpable   Palpable   Ulnar   Palpable   Palpable    Brachial   Palpable   Palpable   Carotid   Palpable, without bruit   Palpable, without bruit   Aorta   Not palpable  N/A   Femoral   Palpable   Palpable   Popliteal   Not palpable   Not palpable   PT  not Palpable  not Palpable   DP  not Palpable  not Palpable    Gastrointestinal: soft, NTTP Musculoskeletal: M/S 5/5 throughout, Extremities without ischemic changes   Neurologic: CN 2-12 intact, Pain and light touch intact in extremities, Motor exam as listed above  Psychiatric: Judgment intact, Mood & affect appropriatefor pt's clinical situation  Dermatologic: See M/S exam for extremity exam, no rashes otherwise noted Right lower leg yellow eschar wound 13x15 mm. less than 1 mm depth No malodor.  Non-Invasive Vascular Imaging  ABI (Date: 04/18/2013)  R: ABI 0.37 Left ABI 0.61  Arterial duplex shows multilevel arterial disease.  Assessment: No real decline in appearance of ulcer and may have shrunk slightly. Although she does have arterial occlusive disease she should have reasonable enough perfusion to heal a pretibial ulcer. I do not believe the risk of arterial intervention outweighs benefit currently.  Plan: Continue compression/in a boot dressings once weekly x4 weeks and followup with me in one month. If the wound has not completely healed at that time we will consider referral to the wound center. If the wound is becoming  progressively larger we would consider whether or not an arterial intervention would be in her interest.  Fabienne Bruns, MD [facility] [place] Office: [phone] Pager: [phone]

## 2013-06-27 ENCOUNTER — Encounter: Payer: Self-pay | Admitting: Vascular Surgery

## 2013-06-28 ENCOUNTER — Ambulatory Visit (INDEPENDENT_AMBULATORY_CARE_PROVIDER_SITE_OTHER): Payer: PRIVATE HEALTH INSURANCE | Admitting: Vascular Surgery

## 2013-06-28 ENCOUNTER — Encounter: Payer: Self-pay | Admitting: Vascular Surgery

## 2013-06-28 VITALS — BP 150/80 | HR 78 | Ht 63.0 in | Wt 121.0 lb

## 2013-06-28 DIAGNOSIS — I739 Peripheral vascular disease, unspecified: Secondary | ICD-10-CM

## 2013-06-28 NOTE — Progress Notes (Signed)
[facility] OF [place]  Referred by:   Jethro Bastos, MD [address] [place], [place] [address]  History of Present Illness  Elizabeth Holder is a 79 y.o. (14-Nov-1916) female who presents with chief complaint: right leg pain with ulcer.  Onset of symptom occurred June 14th 2014.  Pain is described as tighness, severity depends on swelling, and associated with ambulating.  Patient has attempted to treat this pain with wound care and rest with elevation.  she has been receiving 4-layer dressing/inability therapy for 2 months. She returns today for followup. She denies any pain in the ulcer. Recent arteriogram showed one-vessel runoff via the anterior tibial artery with diffuse right superficial femoral artery segments of 70-90% stenosis Atherosclerotic risk factors include: history of hypercholesterolemia and hypertension as well as age. She is a former smoker but quit over 20 years ago.  She currently does not take medication such as satins or beta blockers. Chronic medical problems include stage III kidney dysfunction, macular degeneration legally blind, COPD, overall deconditioning. These are all currently stable.      REVIEW OF SYSTEMS:  (Positives checked otherwise negative)  CARDIOVASCULAR:  [ ]  chest pain, [ ]  chest pressure, [ ]  palpitations, [ ]  shortness of breath when laying flat, [x ] shortness of breath with exertion,   [ ]  pain in feet when walking, [ ]  pain in feet when laying flat, [ ]  history of blood clot in veins (DVT), [ ]  history of phlebitis, [x ] swelling in legs, [ ]  varicose veins  PULMONARY:  [ ]  productive cough, [ ]  asthma, [ ]  wheezing     Physical Examination    Filed Vitals:   06/28/13 1058  BP: 150/80  Pulse: 78  Height: 5\' 3"  (1.6 m)  Weight: 121 lb (54.885 kg)  SpO2: 98%   General: A&O x 3, WDWN  Cardiac: irregular with soft murmur Vascular: Vessel   Right   Left    Radial    Palpable    Palpable    Ulnar    Palpable    Palpable     Brachial    Palpable    Palpable    Carotid    Palpable, without bruit    Palpable, without bruit    Aorta    Not palpable   N/A    Femoral    Palpable    Palpable    Popliteal    Not palpable    Not palpable    PT   not Palpable   not Palpable    DP   not Palpable   not Palpable      Dermatologic: Right pretibial ulceration just above lateral illness dry eschar at this point wound essentially healed but still fragile Non-Invasive Vascular Imaging  ABI (Date: 04/18/2013)  R: ABI 0.37 Left ABI 0.61  Assessment: No real decline in appearance of ulcer and may have shrunk slightly. Although she does have arterial occlusive disease she should have reasonable enough perfusion to heal a pretibial ulcer. I do not believe the risk of arterial intervention outweighs benefit currently.  Plan: Continue compression/in a boot dressings once weekly x4 weeks and followup with me in one month. If the wound has not completely healed at that time we will consider referral to the wound center. If the wound is becoming progressively larger we would consider whether or not an arterial intervention would be in her interest.  Fabienne Bruns, MD [facility] of [facility]  Office: [phone] Pager: [phone]

## 2013-07-24 ENCOUNTER — Other Ambulatory Visit: Payer: Self-pay | Admitting: Dermatology

## 2013-07-25 ENCOUNTER — Encounter: Payer: Self-pay | Admitting: Vascular Surgery

## 2013-07-26 ENCOUNTER — Ambulatory Visit (INDEPENDENT_AMBULATORY_CARE_PROVIDER_SITE_OTHER): Payer: PRIVATE HEALTH INSURANCE | Admitting: Vascular Surgery

## 2013-07-26 ENCOUNTER — Encounter: Payer: Self-pay | Admitting: Vascular Surgery

## 2013-07-26 VITALS — BP 157/68 | HR 78 | Ht 63.0 in | Wt 120.0 lb

## 2013-07-26 DIAGNOSIS — I739 Peripheral vascular disease, unspecified: Secondary | ICD-10-CM

## 2013-07-26 NOTE — Progress Notes (Signed)
[facility] OF [place]  Referred by:   Jethro Bastos, MD [address] [place], [place] [address]  History of Present Illness  Elizabeth Holder is a 79 y.o. (May 15, 1917) female who presents with chief complaint: right leg pain with ulcer.  Onset of symptom occurred June 14th 2014.  She has been doing 4-layer compression dressings for several months to heal the ulcer She returns today for followup. She denies any pain in the ulcer. Recent arteriogram showed one-vessel runoff via the anterior tibial artery with diffuse right superficial femoral artery segments of 70-90% stenosis Atherosclerotic risk factors include: history of hypercholesterolemia and hypertension as well as age. She is a former smoker but quit over 20 years ago.  She currently does not take medication such as satins or beta blockers. Chronic medical problems include stage III kidney dysfunction, macular degeneration legally blind, COPD, overall deconditioning. These are all currently stable.      REVIEW OF SYSTEMS:  (Positives checked otherwise negative)  CARDIOVASCULAR:  [ ]  chest pain, [ ]  chest pressure, [ ]  palpitations, [ ]  shortness of breath when laying flat, [x ] shortness of breath with exertion,   [ ]  pain in feet when walking, [ ]  pain in feet when laying flat, [ ]  history of blood clot in veins (DVT), [ ]  history of phlebitis, [x ] swelling in legs, [ ]  varicose veins  PULMONARY:  [ ]  productive cough, [ ]  asthma, [ ]  wheezing     Physical Examination     Filed Vitals:   07/26/13 1110  BP: 157/68  Pulse: 78  Height: 5\' 3"  (1.6 m)  Weight: 120 lb (54.432 kg)  SpO2: 100%   General: A&O x 3, WDWN  Cardiac: irregular with soft murmur Vascular: Vessel    Right    Left     Radial     Palpable     Palpable     Ulnar     Palpable     Palpable     Brachial     Palpable     Palpable     Carotid     Palpable, without bruit     Palpable, without bruit     Aorta     Not palpable    N/A      Femoral     Palpable     Palpable     Popliteal     Not palpable     Not palpable     PT    not Palpable    not Palpable     DP    not Palpable    not Palpable       Dermatologic: Right pretibial ulceration completely healed   Assessment: Healed ulcer right leg  Plan: Patient was given a prescription today for bilateral 20-30 mm compression stockings. She will followup on as-needed basis.  Fabienne Bruns, MD [facility] [place] Office: [phone] Pager: [phone]

## 2013-11-01 ENCOUNTER — Other Ambulatory Visit: Payer: Self-pay | Admitting: *Deleted

## 2013-11-01 ENCOUNTER — Ambulatory Visit
Admission: RE | Admit: 2013-11-01 | Discharge: 2013-11-01 | Disposition: A | Discharge status: Home / Self Care | Payer: No Typology Code available for payment source | Source: Ambulatory Visit | Attending: *Deleted | Admitting: *Deleted

## 2013-11-01 DIAGNOSIS — R059 Cough, unspecified: Secondary | ICD-10-CM

## 2013-11-01 DIAGNOSIS — Z8709 Personal history of other diseases of the respiratory system: Secondary | ICD-10-CM

## 2013-11-01 DIAGNOSIS — R05 Cough: Secondary | ICD-10-CM

## 2013-11-05 ENCOUNTER — Encounter (HOSPITAL_COMMUNITY): Payer: Self-pay | Admitting: *Deleted

## 2013-11-05 ENCOUNTER — Inpatient Hospital Stay (HOSPITAL_COMMUNITY)
Admission: AD | Admit: 2013-11-05 | Discharge: 2013-11-09 | DRG: 190 | Disposition: A | Discharge status: Skilled Nursing Facility | Payer: Medicare (Managed Care) | Source: Ambulatory Visit | Attending: Internal Medicine | Admitting: Internal Medicine

## 2013-11-05 DIAGNOSIS — J9601 Acute respiratory failure with hypoxia: Secondary | ICD-10-CM

## 2013-11-05 DIAGNOSIS — E1142 Type 2 diabetes mellitus with diabetic polyneuropathy: Secondary | ICD-10-CM | POA: Diagnosis present

## 2013-11-05 DIAGNOSIS — E860 Dehydration: Secondary | ICD-10-CM | POA: Diagnosis present

## 2013-11-05 DIAGNOSIS — J690 Pneumonitis due to inhalation of food and vomit: Secondary | ICD-10-CM

## 2013-11-05 DIAGNOSIS — N183 Chronic kidney disease, stage 3 unspecified: Secondary | ICD-10-CM | POA: Diagnosis present

## 2013-11-05 DIAGNOSIS — D509 Iron deficiency anemia, unspecified: Secondary | ICD-10-CM | POA: Diagnosis present

## 2013-11-05 DIAGNOSIS — H353190 Nonexudative age-related macular degeneration, unspecified eye, stage unspecified: Secondary | ICD-10-CM | POA: Diagnosis present

## 2013-11-05 DIAGNOSIS — Z87891 Personal history of nicotine dependence: Secondary | ICD-10-CM

## 2013-11-05 DIAGNOSIS — L97909 Non-pressure chronic ulcer of unspecified part of unspecified lower leg with unspecified severity: Secondary | ICD-10-CM | POA: Diagnosis present

## 2013-11-05 DIAGNOSIS — N189 Chronic kidney disease, unspecified: Secondary | ICD-10-CM | POA: Insufficient documentation

## 2013-11-05 DIAGNOSIS — N318 Other neuromuscular dysfunction of bladder: Secondary | ICD-10-CM | POA: Diagnosis present

## 2013-11-05 DIAGNOSIS — E872 Acidosis, unspecified: Secondary | ICD-10-CM | POA: Diagnosis present

## 2013-11-05 DIAGNOSIS — E1149 Type 2 diabetes mellitus with other diabetic neurological complication: Secondary | ICD-10-CM | POA: Diagnosis present

## 2013-11-05 DIAGNOSIS — D696 Thrombocytopenia, unspecified: Secondary | ICD-10-CM | POA: Diagnosis present

## 2013-11-05 DIAGNOSIS — R5381 Other malaise: Secondary | ICD-10-CM

## 2013-11-05 DIAGNOSIS — J441 Chronic obstructive pulmonary disease with (acute) exacerbation: Principal | ICD-10-CM | POA: Diagnosis present

## 2013-11-05 DIAGNOSIS — Z66 Do not resuscitate: Secondary | ICD-10-CM | POA: Diagnosis present

## 2013-11-05 DIAGNOSIS — I739 Peripheral vascular disease, unspecified: Secondary | ICD-10-CM | POA: Diagnosis present

## 2013-11-05 DIAGNOSIS — H548 Legal blindness, as defined in USA: Secondary | ICD-10-CM | POA: Diagnosis present

## 2013-11-05 DIAGNOSIS — E876 Hypokalemia: Secondary | ICD-10-CM | POA: Diagnosis present

## 2013-11-05 DIAGNOSIS — R5383 Other fatigue: Secondary | ICD-10-CM

## 2013-11-05 DIAGNOSIS — Z79899 Other long term (current) drug therapy: Secondary | ICD-10-CM

## 2013-11-05 DIAGNOSIS — J9620 Acute and chronic respiratory failure, unspecified whether with hypoxia or hypercapnia: Secondary | ICD-10-CM

## 2013-11-05 DIAGNOSIS — E43 Unspecified severe protein-calorie malnutrition: Secondary | ICD-10-CM | POA: Diagnosis present

## 2013-11-05 LAB — CBC WITH DIFFERENTIAL/PLATELET
BASOS ABS: 0 10*3/uL (ref 0.0–0.1)
Basophils Relative: 0 % (ref 0–1)
EOS PCT: 1 % (ref 0–5)
Eosinophils Absolute: 0.1 10*3/uL (ref 0.0–0.7)
HCT: 27.9 % — ABNORMAL LOW (ref 36.0–46.0)
Hemoglobin: 9.1 g/dL — ABNORMAL LOW (ref 12.0–15.0)
LYMPHS PCT: 49 % — AB (ref 12–46)
Lymphs Abs: 3 10*3/uL (ref 0.7–4.0)
MCH: 23.8 pg — ABNORMAL LOW (ref 26.0–34.0)
MCHC: 32.6 g/dL (ref 30.0–36.0)
MCV: 73 fL — AB (ref 78.0–100.0)
Monocytes Absolute: 0.4 10*3/uL (ref 0.1–1.0)
Monocytes Relative: 6 % (ref 3–12)
NEUTROS PCT: 44 % (ref 43–77)
Neutro Abs: 2.7 10*3/uL (ref 1.7–7.7)
PLATELETS: 90 10*3/uL — AB (ref 150–400)
RBC: 3.82 MIL/uL — AB (ref 3.87–5.11)
RDW: 15.9 % — AB (ref 11.5–15.5)
WBC: 6.2 10*3/uL (ref 4.0–10.5)

## 2013-11-05 LAB — URINALYSIS, ROUTINE W REFLEX MICROSCOPIC
Bilirubin Urine: NEGATIVE
Glucose, UA: NEGATIVE mg/dL
KETONES UR: NEGATIVE mg/dL
NITRITE: NEGATIVE
PH: 5.5 (ref 5.0–8.0)
Protein, ur: NEGATIVE mg/dL
Specific Gravity, Urine: 1.019 (ref 1.005–1.030)
Urobilinogen, UA: 0.2 mg/dL (ref 0.0–1.0)

## 2013-11-05 LAB — COMPREHENSIVE METABOLIC PANEL
ALBUMIN: 3.3 g/dL — AB (ref 3.5–5.2)
ALT: 22 U/L (ref 0–35)
AST: 24 U/L (ref 0–37)
Alkaline Phosphatase: 57 U/L (ref 39–117)
BUN: 37 mg/dL — AB (ref 6–23)
CO2: 30 mEq/L (ref 19–32)
CREATININE: 1.51 mg/dL — AB (ref 0.50–1.10)
Calcium: 10.8 mg/dL — ABNORMAL HIGH (ref 8.4–10.5)
Chloride: 94 mEq/L — ABNORMAL LOW (ref 96–112)
GFR calc non Af Amer: 28 mL/min — ABNORMAL LOW (ref >90)
GFR, EST AFRICAN AMERICAN: 33 mL/min — AB (ref >90)
GLUCOSE: 100 mg/dL — AB (ref 70–99)
Potassium: 3.6 mEq/L — ABNORMAL LOW (ref 3.7–5.3)
Sodium: 138 mEq/L (ref 137–147)
TOTAL PROTEIN: 5.7 g/dL — AB (ref 6.0–8.3)
Total Bilirubin: 0.3 mg/dL (ref 0.3–1.2)

## 2013-11-05 LAB — URINE MICROSCOPIC-ADD ON

## 2013-11-05 MED ORDER — GABAPENTIN 400 MG PO CAPS
400.0000 mg | ORAL_CAPSULE | Freq: Two times a day (BID) | ORAL | Status: DC
  Administered 2013-11-05 – 2013-11-09 (×6): 400 mg via ORAL
  Filled 2013-11-05 (×12): qty 1

## 2013-11-05 MED ORDER — PREDNISONE 20 MG PO TABS
40.0000 mg | ORAL_TABLET | Freq: Every day | ORAL | Status: DC
  Filled 2013-11-05 (×3): qty 2

## 2013-11-05 MED ORDER — IPRATROPIUM BROMIDE 0.02 % IN SOLN
0.5000 mg | Freq: Four times a day (QID) | RESPIRATORY_TRACT | Status: DC
  Administered 2013-11-05: 0.5 mg via RESPIRATORY_TRACT
  Filled 2013-11-05: qty 2.5

## 2013-11-05 MED ORDER — LEVALBUTEROL HCL 0.63 MG/3ML IN NEBU: 0.6300 mg | INHALATION_SOLUTION | Freq: Four times a day (QID) | RESPIRATORY_TRACT | Status: DC | PRN

## 2013-11-05 MED ORDER — HEPARIN SODIUM (PORCINE) 5000 UNIT/ML IJ SOLN
5000.0000 [IU] | Freq: Three times a day (TID) | INTRAMUSCULAR | Status: DC
  Administered 2013-11-05 – 2013-11-07 (×5): 5000 [IU] via SUBCUTANEOUS
  Filled 2013-11-05 (×8): qty 1

## 2013-11-05 MED ORDER — SODIUM CHLORIDE 0.9 % IJ SOLN
3.0000 mL | Freq: Two times a day (BID) | INTRAMUSCULAR | Status: DC
  Administered 2013-11-06 – 2013-11-09 (×7): 3 mL via INTRAVENOUS

## 2013-11-05 MED ORDER — LEVALBUTEROL HCL 0.63 MG/3ML IN NEBU
0.6300 mg | INHALATION_SOLUTION | Freq: Four times a day (QID) | RESPIRATORY_TRACT | Status: DC
  Administered 2013-11-05: 0.63 mg via RESPIRATORY_TRACT
  Filled 2013-11-05: qty 3

## 2013-11-05 MED ORDER — IPRATROPIUM-ALBUTEROL 0.5-2.5 (3) MG/3ML IN SOLN
3.0000 mL | RESPIRATORY_TRACT | Status: DC | PRN
  Administered 2013-11-08: 3 mL via RESPIRATORY_TRACT

## 2013-11-05 MED ORDER — IPRATROPIUM-ALBUTEROL 0.5-2.5 (3) MG/3ML IN SOLN
3.0000 mL | Freq: Four times a day (QID) | RESPIRATORY_TRACT | Status: DC
  Administered 2013-11-06 – 2013-11-07 (×6): 3 mL via RESPIRATORY_TRACT
  Filled 2013-11-05 (×7): qty 3

## 2013-11-05 MED ORDER — DOXYCYCLINE HYCLATE 100 MG PO TABS
100.0000 mg | ORAL_TABLET | Freq: Two times a day (BID) | ORAL | Status: DC
  Administered 2013-11-05: 100 mg via ORAL
  Filled 2013-11-05 (×3): qty 1

## 2013-11-05 MED ORDER — DEXTROSE-NACL 5-0.9 % IV SOLN
INTRAVENOUS | Status: DC
  Administered 2013-11-05 – 2013-11-07 (×3): via INTRAVENOUS

## 2013-11-05 NOTE — Progress Notes (Signed)
Elizabeth Holder [id number] Admission Data: 11/05/2013 7:34 PM Attending Provider: Karren Cobble, MD  WSF:[id number],ZGYFVC NICHOLAS, MD Consults/ Treatment Team:    Elizabeth Holder is a 80 y.o. female patient admitted  Directly from [facility] awake, alert  & orientated  X 3,  DNR, VSS - Blood pressure 139/79, pulse 114, temperature 98.3 F (36.8 C), temperature source Oral, resp. rate 16, height 5\' 3"  (1.6 m), weight 49.85 kg (109 lb 14.4 oz), SpO2 93.00%., , no c/o shortness of breath, no c/o chest pain, no distress noted. Awaiting orders from MD.  .  Allergies:   Allergies  Allergen Reactions  . Augmentin [Amoxicillin-Pot Clavulanate] Diarrhea     Past Medical History  Diagnosis Date  . Peripheral arterial disease   . Ulcer     Non-healing ischemic ulcer RIGHT LEG  . Marginal zone lymphoma, spleen   . Macular degeneration (senile) of retina, unspecified   . Legal blindness, as defined in [place]   . Polyneuropathy in diabetes(357.2)   . Brachial neuritis or radiculitis NOS   . COPD (chronic obstructive pulmonary disease)   . Chronic kidney disease     stage III  . Abnormality of gait   . Hypertonicity of bladder     History:  obtained from the patient.   Pt orientation to unit, room and routine. Information packet given to patient/family and safety video watched.  Admission INP armband ID verified with patient/family, and in place. SR up x 2, fall risk assessment complete with Patient and family verbalizing understanding of risks associated with falls. Pt verbalizes an understanding of how to use the call bell and to call for help before getting out of bed.  Skin, clean-dry- intact without evidence of bruising, or skin tears.   No evidence of skin break down noted on exam.     Will cont to monitor and assist as needed.  Park Breed, RN 11/05/2013 7:34 PM

## 2013-11-05 NOTE — H&P (Signed)
Date: 11/06/2013               Patient Name:  Elizabeth Holder MRN: [id number]  DOB: 02/18/1917 Age / Sex: 80 y.o., female   PCP: Janifer Adie, MD         Medical Service: Internal Medicine Teaching Service         Attending Physician: Dr. Karren Cobble, MD    First Contact: Dr. Ronnald Ramp Pager: [phone]  Second Contact: Dr. 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 Presented to the Emergency department with c/o of SOB and generalised weakness.   Generalised weakness started about 5 days ago, pt said she could not get up. She had diarrhea lasting about 1-2 days, and was treated with imodium prescribed by the [facility] doctor, now having normal bowel movements today.  Pt endorsed Reduced appetite, No hx of  vomiting or Abdominal pain.  Shortness of breath started on 4 days ago, on friday, and has progressively gotten worse, with associated cough, worsening since onset but not productive of sputum. Pts thinks her daughter had a cough, before pt herself got ill, but No fever, nebs at home did not improve symptoms, no chest pain, no PND, Patient uses 1 pillow to sleep over the past year, no pedal edema, no leg pain, . Previously smoked 1-2 packs per day, in her 45s- 63s, does not use O2 at home.    Pt was seen by a doctor at the [facility] on Friday- with same complaints where a flu swab was done and was negative, CBC, UA and chest xray, were unremarkable, with a Bmet revealing worsening renal function, with increased Ca, pt was discharged home in the care of her granddaughter to increase oral hydration. Pt was then seen again today, and decline in clinical status was noted with dehydration, pt  was given 1L of D5N/s for IV resuscitation and to send the pt to the hospital for admission.   Medications Prior to Admission  Medication Sig Dispense Refill  . Calcium Carbonate-Vit D-Min 600-400 MG-UNIT TABS Take 1 tablet by mouth daily.      Marland Kitchen ENSURE PLUS (ENSURE PLUS) LIQD Take 237 mLs by mouth as needed.       . gabapentin (NEURONTIN) 400 MG capsule Take 400 mg by mouth 2 (two) times daily.      . Multiple Vitamins-Minerals (MULTIVITAMIN WITH MINERALS) tablet Take 1 tablet by mouth daily.      . mupirocin cream (BACTROBAN) 2 % Apply 1 application topically 2 (two) times daily.          Meds: Current Facility-Administered Medications  Medication Dose Route Frequency Provider Last Rate Last Dose  . dextrose 5 %-0.9 % sodium chloride infusion   Intravenous Continuous Otho Bellows, MD 75 mL/hr at 11/06/13 0159    . doxycycline (VIBRA-TABS) tablet 100 mg  100 mg Oral Q12H Otho Bellows, MD   100 mg at 11/05/13 2201  . gabapentin (NEURONTIN) capsule 400 mg  400 mg Oral BID Otho Bellows, MD   400 mg at 11/05/13 2201  . heparin injection 5,000 Units  5,000 Units Subcutaneous Q8H Otho Bellows, MD  5,000 Units at 11/06/13 0503  . ipratropium-albuterol (DUONEB) 0.5-2.5 (3) MG/3ML nebulizer solution 3 mL  3 mL Nebulization Q6H Hester Mates, MD   3 mL at 11/06/13 0228  . ipratropium-albuterol (DUONEB) 0.5-2.5 (3) MG/3ML nebulizer solution 3 mL  3 mL Nebulization Q2H PRN Hester Mates, MD      . predniSONE (DELTASONE) tablet 40 mg  40 mg Oral Q breakfast Otho Bellows, MD      . sodium chloride 0.9 % injection 3 mL  3 mL Intravenous Q12H Otho Bellows, MD        Allergies: Allergies as of 11/05/2013 - Review Complete 11/05/2013  Allergen Reaction Noted  . Augmentin [amoxicillin-pot clavulanate] Diarrhea 04/24/2013   Past Medical History  Diagnosis Date  . Peripheral arterial disease   . Ulcer     Non-healing ischemic ulcer RIGHT LEG  . Marginal zone lymphoma, spleen   .  Macular degeneration (senile) of retina, unspecified   . Legal blindness, as defined in [place]   . Polyneuropathy in diabetes(357.2)   . Brachial neuritis or radiculitis NOS   . COPD (chronic obstructive pulmonary disease)   . Chronic kidney disease     stage III  . Abnormality of gait   . Hypertonicity of bladder    Past Surgical History  Procedure Laterality Date  . Eye surgery Bilateral     cataract   Family History  Problem Relation Age of Onset  . Other Mother     macular degeneration  . Other Father    History   Social History  . Marital Status: Single    Spouse Name: N/A    Number of Children: N/A  . Years of Education: N/A   Occupational History  . Not on file.   Social History Main Topics  . Smoking status: Former Smoker    Quit date: 04/25/1993  . Smokeless tobacco: Never Used  . Alcohol Use: Yes     Comment: social drinker  . Drug Use: No  . Sexual Activity: No   Other Topics Concern  . Not on file   Social History Narrative  . No narrative on file    Review of Systems: CONSTITUTIONAL- No Fever, reduced appetite HEAD- No Headache. EYES- Vision loss- legally blind. CARDIAC- No Palpitations,  or chest pain. URINARY- Endorses nocturia- every 2 hours, but no dysuria, but has a hx of overactive bladder with urinary incontinence. NEUROLOGIC- No syncope.  Physical Exam: Blood pressure 148/83, pulse 66, temperature 96 F (35.6 C), temperature source Oral, resp. rate 32, height 5\' 3"  (1.6 m), weight 109 lb 14.4 oz (49.85 kg), SpO2 94.00%. GENERAL- alert, co-operative, in mild respiratory distress, coughing. HEENT- Atraumatic, normocephalic, EOMI, oral mucosa appears moist. CARDIAC- Heart sounds hard to appreciate due to rhonchorus overlying breath sounds. RESP- Moving equal volumes of air, rhonchorus coarse breath sounds bilaterally- ant and post.  ABDOMEN- Soft,non tender, no palpable masses or organomegaly, bowel sounds present. NEURO- No obvious Cr N  abnormalities, strenght equal and present in all extremities. EXTREMITIES- pulse 2+, symmetric, no pedal edema. SKIN- Warm, dry, superficial nontender reddish patches- appears chronic on bilat lower extremities.  PSYCH- Normal mood and affect, appropriate thought content and speech.  Lab results: Basic Metabolic Panel:  Recent Labs  11/05/13 1948  NA 138  K 3.6*  CL 94*  CO2 30  GLUCOSE 100*  BUN 37*  CREATININE 1.51*  CALCIUM 10.8*   AG- 14.  Liver Function Tests:  Recent Labs  11/05/13  1948  AST 24  ALT 22  ALKPHOS 57  BILITOT 0.3  PROT 5.7*  ALBUMIN 3.3*   No results found for this basename: LIPASE, AMYLASE,  in the last 72 hours No results found for this basename: AMMONIA,  in the last 72 hours CBC:  Recent Labs  11/05/13 1948  WBC 6.2  NEUTROABS 2.7  HGB 9.1*  HCT 27.9*  MCV 73.0*  PLT 90*   Cardiac Enzymes: No results found for this basename: CKTOTAL, CKMB, CKMBINDEX, TROPONINI,  in the last 72 hours BNP: No results found for this basename: PROBNP,  in the last 72 hours D-Dimer: No results found for this basename: DDIMER,  in the last 72 hours CBG: No results found for this basename: GLUCAP,  in the last 72 hours Hemoglobin A1C: No results found for this basename: HGBA1C,  in the last 72 hours Fasting Lipid Panel: No results found for this basename: CHOL, HDL, LDLCALC, TRIG, CHOLHDL, LDLDIRECT,  in the last 72 hours Thyroid Function Tests: No results found for this basename: TSH, T4TOTAL, FREET4, T3FREE, THYROIDAB,  in the last 72 hours Anemia Panel: No results found for this basename: VITAMINB12, FOLATE, FERRITIN, TIBC, IRON, RETICCTPCT,  in the last 72 hours Coagulation: No results found for this basename: LABPROT, INR,  in the last 72 hours Urine Drug Screen: Drugs of Abuse  No results found for this basename: labopia,  cocainscrnur,  labbenz,  amphetmu,  thcu,  labbarb    Alcohol Level: No results found for this basename: ETH,  in the  last 72 hours Urinalysis:  Recent Labs  11/05/13 2149  COLORURINE YELLOW  LABSPEC 1.019  PHURINE 5.5  GLUCOSEU NEGATIVE  HGBUR MODERATE*  BILIRUBINUR NEGATIVE  KETONESUR NEGATIVE  PROTEINUR NEGATIVE  UROBILINOGEN 0.2  NITRITE NEGATIVE  [place]. Labs:   Imaging results:  No results found.  Other results: EKG:   Assessment & Plan by Problem: Principal Problem:   COPD exacerbation  COPD exacerbation- Considering progressive worsening of SOB and Cough- meeting 2 of the 3 cardinal symtoms, desat 79% on room air, was placed on O2 by nasal cannula, tachypnea and tachycardia mostly likely driven by dyspnea. For likley exacerbating foctors- Viral respiratory tract infection, considering hx of sick contacts. Chest xray and CBC not suggestive of active infective/bacterial process. Unlikely PE- Considering gradual worsening in SOB, no pleuritic chest pain or hemoptysis, and correction of hypoxemia- ( O2 sats dropped to 79%) with O2, improved to 99%. Calculated Wells score- 1.5 points, putting her in a Low risk group: 1.3% chance of PE in an ED population. - Prednisone- 40mg  daily. - Duonebs- Q2H PRN, Q6H. - Doxycline- 100mg  BID - Consider ABG if patient does not improve on nebs, or desaturates on O2. - EKG   Generalized Weakness- Likely due to diarrhea, as per notes from [facility], pt has chronic diarrhea, but pt says this has resolved. BUN and Cr- has improved compared to values gotten at [facility]- 2/5/204- BUN- was 48, Cr- was 2.02. - Cont D5 N/s at 33mls/hr - Bmet in the Am - UA  Electrolyte abnormality- Mild hypokalemia- K- 3.6. Likely from Chronic diarrhea. Also Anion gap-15, but Bicarb- high normal- 30, likley contribution of several factors- likely respiratory acidosis, starvation ketosis, lactic acidosis, an ABG would clarify, consider if pts clinical status worsens. Will repeat Bmet in the [place] with IV KCL 37meq X4 - Follow Bmet in the Am.  Diabetic  Polyneuropathy- Not on any hypoglycemic agent. -  Cont Gabapentin- 400mg  BID.  DVT PPx- Heparin 5000 Q8H.  CODE- DNR/DNI, as per pts wishes.   Dispo: Disposition is deferred at this time, awaiting improvement of current medical problems. Anticipated discharge in approximately 2-3 day(s).   The patient does have a current PCP Janifer Adie, MD) and does need an [facility] hospital follow-up appointment after discharge.  The patient does have transportation limitations that hinder transportation to clinic appointments.  Signed: Jenetta Downer, MD 11/06/2013, 6:39 AM

## 2013-11-05 NOTE — Progress Notes (Signed)
I spoke to the patient and her family (her son Eddie Dibbles, his wife, and the patient's granddaughter) this evening regarding the patient's wishes. The patient previously was DNR but then decided to change her code status to a limited code with DNI only. Today, however, the patient stated that she does not want CPR, vasopressors, or intubation and would like to be a full DNR/DNI.  I have updated her code status to DNR/DNI.

## 2013-11-06 ENCOUNTER — Observation Stay (HOSPITAL_COMMUNITY): Payer: Medicare (Managed Care)

## 2013-11-06 ENCOUNTER — Encounter (HOSPITAL_COMMUNITY): Payer: Self-pay | Admitting: *Deleted

## 2013-11-06 DIAGNOSIS — J9601 Acute respiratory failure with hypoxia: Secondary | ICD-10-CM | POA: Diagnosis not present

## 2013-11-06 DIAGNOSIS — J690 Pneumonitis due to inhalation of food and vomit: Secondary | ICD-10-CM | POA: Diagnosis present

## 2013-11-06 LAB — BLOOD GAS, ARTERIAL
Acid-Base Excess: 0.1 mmol/L (ref 0.0–2.0)
BICARBONATE: 27.5 meq/L — AB (ref 20.0–24.0)
Drawn by: 331001
FIO2: 100 %
O2 SAT: 99.7 %
PO2 ART: 255 mmHg — AB (ref 80.0–100.0)
Patient temperature: 98.6
TCO2: 29.8 mmol/L (ref 0–100)
pCO2 arterial: 79.7 mmHg (ref 35.0–45.0)
pH, Arterial: 7.173 — CL (ref 7.350–7.450)

## 2013-11-06 LAB — CBC
HEMATOCRIT: 30.3 % — AB (ref 36.0–46.0)
HEMOGLOBIN: 9.7 g/dL — AB (ref 12.0–15.0)
MCH: 23.7 pg — AB (ref 26.0–34.0)
MCHC: 32 g/dL (ref 30.0–36.0)
MCV: 74.1 fL — AB (ref 78.0–100.0)
Platelets: 137 10*3/uL — ABNORMAL LOW (ref 150–400)
RBC: 4.09 MIL/uL (ref 3.87–5.11)
RDW: 15.8 % — AB (ref 11.5–15.5)
WBC: 24.2 10*3/uL — ABNORMAL HIGH (ref 4.0–10.5)

## 2013-11-06 LAB — URINE MICROSCOPIC-ADD ON

## 2013-11-06 LAB — BASIC METABOLIC PANEL
BUN: 35 mg/dL — AB (ref 6–23)
CHLORIDE: 96 meq/L (ref 96–112)
CO2: 27 meq/L (ref 19–32)
CREATININE: 1.47 mg/dL — AB (ref 0.50–1.10)
Calcium: 10.3 mg/dL (ref 8.4–10.5)
GFR calc Af Amer: 34 mL/min — ABNORMAL LOW (ref >90)
GFR calc non Af Amer: 29 mL/min — ABNORMAL LOW (ref >90)
GLUCOSE: 189 mg/dL — AB (ref 70–99)
Potassium: 4.5 mEq/L (ref 3.7–5.3)
Sodium: 138 mEq/L (ref 137–147)

## 2013-11-06 LAB — GLUCOSE, CAPILLARY
Glucose-Capillary: 148 mg/dL — ABNORMAL HIGH (ref 70–99)
Glucose-Capillary: 93 mg/dL (ref 70–99)
Glucose-Capillary: 119 mg/dL — ABNORMAL HIGH (ref 70–99)

## 2013-11-06 LAB — URINALYSIS, ROUTINE W REFLEX MICROSCOPIC
Bilirubin Urine: NEGATIVE
Glucose, UA: NEGATIVE mg/dL
Hgb urine dipstick: NEGATIVE
KETONES UR: NEGATIVE mg/dL
LEUKOCYTES UA: NEGATIVE
Nitrite: NEGATIVE
PH: 5 (ref 5.0–8.0)
PROTEIN: 30 mg/dL — AB
Specific Gravity, Urine: 1.019 (ref 1.005–1.030)
Urobilinogen, UA: 0.2 mg/dL (ref 0.0–1.0)

## 2013-11-06 LAB — MRSA PCR SCREENING: MRSA BY PCR: NEGATIVE

## 2013-11-06 LAB — HIV ANTIBODY (ROUTINE TESTING W REFLEX): HIV: NONREACTIVE

## 2013-11-06 LAB — CK: Total CK: 198 U/L — ABNORMAL HIGH (ref 7–177)

## 2013-11-06 LAB — MAGNESIUM: MAGNESIUM: 1.5 mg/dL (ref 1.5–2.5)

## 2013-11-06 LAB — STREP PNEUMONIAE URINARY ANTIGEN: STREP PNEUMO URINARY ANTIGEN: NEGATIVE

## 2013-11-06 MED ORDER — MORPHINE SULFATE 2 MG/ML IJ SOLN: 1.0000 mg | INTRAMUSCULAR | Status: DC | PRN

## 2013-11-06 MED ORDER — MORPHINE SULFATE 2 MG/ML IJ SOLN
INTRAMUSCULAR | Status: AC
  Administered 2013-11-06: 1 mg via INTRAVENOUS
  Filled 2013-11-06: qty 1

## 2013-11-06 MED ORDER — INSULIN ASPART 100 UNIT/ML ~~LOC~~ SOLN: 0.0000 [IU] | SUBCUTANEOUS | Status: DC

## 2013-11-06 MED ORDER — PIPERACILLIN-TAZOBACTAM IN DEX 2-0.25 GM/50ML IV SOLN
2.2500 g | Freq: Three times a day (TID) | INTRAVENOUS | Status: DC
  Administered 2013-11-06 – 2013-11-08 (×6): 2.25 g via INTRAVENOUS
  Filled 2013-11-06 (×10): qty 50

## 2013-11-06 MED ORDER — POTASSIUM CHLORIDE 10 MEQ/100ML IV SOLN
10.0000 meq | INTRAVENOUS | Status: AC
  Administered 2013-11-06 (×5): 10 meq via INTRAVENOUS
  Filled 2013-11-06 (×4): qty 100

## 2013-11-06 MED ORDER — DEXTROSE 5 % IV SOLN
500.0000 mg | INTRAVENOUS | Status: DC
  Administered 2013-11-06 – 2013-11-08 (×3): 500 mg via INTRAVENOUS
  Filled 2013-11-06 (×3): qty 500

## 2013-11-06 MED ORDER — MORPHINE SULFATE 2 MG/ML IJ SOLN
1.0000 mg | INTRAMUSCULAR | Status: DC | PRN
  Administered 2013-11-08 – 2013-11-09 (×3): 1 mg via INTRAVENOUS
  Filled 2013-11-06 (×3): qty 1

## 2013-11-06 MED ORDER — AZITHROMYCIN 500 MG PO TABS
500.0000 mg | ORAL_TABLET | ORAL | Status: DC
  Filled 2013-11-06 (×2): qty 1

## 2013-11-06 MED ORDER — DEXTROSE 5 % IV SOLN
1.0000 g | INTRAVENOUS | Status: DC
  Filled 2013-11-06: qty 10

## 2013-11-06 NOTE — Progress Notes (Signed)
Patient received in ICU as stepdown over flow at 0940. Coherent but confused. Bipap in place, SPO2 100, RESPIRATORY RATE 28, BP 90/45. Foley placed.

## 2013-11-06 NOTE — Progress Notes (Signed)
@  0842 pt placed on BIPAP at this time with Rapid Response and MD at bedside. Pt placed on 14/7 and 50% and tolerating well. Pt WOB has calmed down and pt is resting at this time. Awaiting transfer orders. RT will continue to monitor.

## 2013-11-06 NOTE — Progress Notes (Signed)
ANTIBIOTIC CONSULT NOTE - INITIAL  Pharmacy Consult for Zosyn Indication: pneumonia?  Allergies  Allergen Reactions  . Augmentin [Amoxicillin-Pot Clavulanate] Diarrhea    Patient Measurements: Height: 5\' 3"  (160 cm) Weight: 109 lb 14.4 oz (49.85 kg) IBW/kg (Calculated) : 52.4 Adjusted Body Weight: n/a  Vital Signs: Temp: 96 F (35.6 C) (02/10 0436) Temp src: Oral (02/10 0436) BP: 148/83 mmHg (02/10 0436) Pulse Rate: 102 (02/10 0842) Intake/Output from previous day: 02/09 0701 - 02/10 0700 In: 761.3 [I.V.:761.3] Out: -  Intake/Output from this shift:    Labs:  Recent Labs  11/05/13 1948 11/06/13 0532  WBC 6.2 24.2*  HGB 9.1* 9.7*  PLT 90* 137*  CREATININE 1.51* 1.47*   Estimated Creatinine Clearance: 18 ml/min (by C-G formula based on Cr of 1.47). No results found for this basename: VANCOTROUGH, VANCOPEAK, VANCORANDOM, GENTTROUGH, GENTPEAK, GENTRANDOM, TOBRATROUGH, TOBRAPEAK, TOBRARND, AMIKACINPEAK, AMIKACINTROU, AMIKACIN,  in the last 72 hours   Microbiology: No results found for this or any previous visit (from the past 720 hour(s)).  Medical History: Past Medical History  Diagnosis Date  . Peripheral arterial disease   . Ulcer     Non-healing ischemic ulcer RIGHT LEG  . Marginal zone lymphoma, spleen   . Macular degeneration (senile) of retina, unspecified   . Legal blindness, as defined in [place]   . Polyneuropathy in diabetes(357.2)   . Brachial neuritis or radiculitis NOS   . COPD (chronic obstructive pulmonary disease)   . Chronic kidney disease     stage III  . Abnormality of gait   . Hypertonicity of bladder     Medications:  Prescriptions prior to admission  Medication Sig Dispense Refill  . Calcium Carbonate-Vit D-Min 600-400 MG-UNIT TABS Take 1 tablet by mouth daily.      Marland Kitchen ENSURE PLUS (ENSURE PLUS) LIQD Take 237 mLs by mouth as needed.       . gabapentin (NEURONTIN) 400 MG capsule Take 400 mg by mouth 2 (two) times daily.      . Multiple  Vitamins-Minerals (MULTIVITAMIN WITH MINERALS) tablet Take 1 tablet by mouth daily.      . mupirocin cream (BACTROBAN) 2 % Apply 1 application topically 2 (two) times daily.        Assessment: Elizabeth Holder with COPD exacerbation to start on zosyn and azithromycin for possible pneumonia. WBC 24.2. Afebrile. CrCl ~ 18 mL/min  Cultures: 2/10 Blood cx x2>> 2/9 Urine Cx >> 2/10 Sputum Cx >>   Goal of Therapy:  Eradication of infection  Plan:  1. Start Zosyn 2.25 gm IV Q 8 hours  2. Azithromycin 500 mg IV Q 24 hours  3. Monitor CBC, renal fx, cultures and patient's clinical status.   Albertina Parr, PharmD.  Clinical Pharmacist Pager [phone]

## 2013-11-06 NOTE — Progress Notes (Signed)
Entered patients room, patient moving about the bed, restless, using accessory muscles to breathe. Breath sounds crackles in the bases, rhonchi throughout. O2 stats were 81%. Increased oxygen from 2 to 6 LPM. Sats came up to 93%, but still struggling to breath easily. Notified MD and called rapid response nurse. Joslyn Hy, MSN, RN, [employer]

## 2013-11-06 NOTE — H&P (Signed)
Internal Medicine Attending Admission Note Date: 11/06/2013  Patient name: Elizabeth Holder Medical record number: [id number] Date of birth: 12-27-1916 Age: 80 y.o. Gender: female  I saw and evaluated the patient. I reviewed the resident's note and I agree with the resident's findings and plan as documented in the resident's note.  [name] is a 80 year old woman with a history of chronic obstructive pulmonary disease, diabetes, macular degeneration resulting in legal blindness, and peripheral arterial disease who presents with a five-day history of progressive weakness and a four-day history of progressive shortness of breath associated with a nonproductive cough. She was in her usual state of health until 5 days prior to admission when she developed 2 days of diarrhea. This was treated with as needed Imodium and normalization of her stools. At this time she began to develop generalized weakness. Four days prior to admission she developed shortness of breath that has progressed subsequently. This is associated with a nonproductive cough and no orthopnea, paroxysmal nocturnal dyspnea, chest pain, fevers, shakes, chills, or sick contacts. At the onset of shortness of breath the flu swab was obtained and was negative and a chest x-ray was apparently unremarkable. With progressive dyspnea she presented to the emergency department and was admitted to the internal medicine teaching service for further care. She and her son are clear that she is to be on a DO NOT INTUBATE and DO NOT RESUSCITATE status.  When I first saw her this morning she was in moderate respiratory distress with increased work of breathing. She was very restless in the bed and was constantly moving. Pulmonary auscultation was notable for very tight wheezing and poor air movement. An arterial blood gas revealed a mainly acute on chronic respiratory acidosis. Repeat chest x-ray revealed a right middle lobe infiltrate and a streaky infiltrate in  the right base best seen on the lateral.  I am concerned [name] may have had a COPD exacerbation secondary to a viral infection although I believe the acute decompensation is likely secondary to an aspiration given the location of the infiltrate. We have treated the work of breathing with BiPAP to allow her to rest comfortably. We have started antibiotics for an aspiration pneumonia, specifically Zosyn intravenously. She's been transferred to a step down unit while receiving the BiPAP therapy. It is hoped with the bronchodilators, antibiotics, and rest with the BiPAP she will improve over the next one to 2 days and be stable for transfer to the floor to further recover from this likely aspiration pneumonia. Once stabilized we will obtain a swallowing evaluation given our concern for aspiration.

## 2013-11-06 NOTE — Progress Notes (Signed)
CRITICAL VALUE ALERT  Critical value received:  ABG results  Date of notification: 11/06/2013  Time of notification: 0841  Critical value read back: Yes  Nurse who received alert: Aline Brochure, RN  MD notified (1st page): [phone]  - in room with patient at time results received  Time of first page: na  MD notified (2nd page):  Time of second page:  Responding MD:  Dr. Natasha Bence  Time MD responded:  [phone]

## 2013-11-06 NOTE — Progress Notes (Signed)
INITIAL NUTRITION ASSESSMENT  DOCUMENTATION CODES Per approved criteria  -Severe malnutrition in the context of acute illness or injury   INTERVENTION: Recommend Ensure Complete po BID, each supplement provides 350 kcal and 13 grams of protein, once diet order permits. RD to continue to follow nutrition care plan.  NUTRITION DIAGNOSIS: Inadequate oral intake related to inability to eat as evidenced by NPO status.   Goal: Diet advancement as tolerated. Intake to meet >90% of estimated nutrition needs.  Monitor:  weight trends, lab trends, I/O's, diet advacement  Reason for Assessment: Malnutrition Screening Tool  80 y.o. female  Admitting Dx: COPD exacerbation  ASSESSMENT: PMHx significant for PAD, COPD, CKD III, legally blind. Admitted with SOB and generalized weakness x 5 days; diarrhea x 1-2 days. Work-up reveals COPD exacerbation with likely respiratory tract infection.  Rapid response notified this morning 2/2 low O2 states and RR distress. Placed on Bipap this morning and transferred from 5W to 95M.  Pt reports that she has lost 7 lb x 3 weeks. Pt with poor appetite during this time, states nothing tastes good. She states that she was instructed to drink Ensure TID PTA, but has not been able to when she doesn't feel well.   Nutrition Focused Physical Exam:  Subcutaneous Fat:  Orbital Region: moderate depletion Upper Arm Region: severe depletion Thoracic and Lumbar Region: n/a  Muscle:  Temple Region: moderate depletion Clavicle Bone Region: severe depletion Clavicle and Acromion Bone Region: severe depletion Scapular Bone Region: n/a Dorsal Hand: n/a Patellar Region: n/a Anterior Thigh Region: n/a Posterior Calf Region: n/a  Edema: none  Pt meets criteria for severe MALNUTRITION in the context of acute illness as evidenced by 7% wt loss x <1 month and intake of <50% of intake; also with severe fat and muscle mass loss.    Height: Ht Readings from Last 1  Encounters:  11/06/13 5\' 3"  (1.6 m)    Weight: Wt Readings from Last 1 Encounters:  11/06/13 112 lb 14 oz (51.2 kg)    Ideal Body Weight: 115 lb  % Ideal Body Weight: 97%  Wt Readings from Last 10 Encounters:  11/06/13 112 lb 14 oz (51.2 kg)  07/26/13 120 lb (54.432 kg)  06/28/13 121 lb (54.885 kg)  05/31/13 119 lb 9.6 oz (54.25 kg)  04/27/13 121 lb (54.885 kg)  04/27/13 121 lb (54.885 kg)  04/25/13 121 lb (54.885 kg)    Usual Body Weight: 120 lb  % Usual Body Weight: 93%  BMI:  Body mass index is 20 kg/(m^2). Normal weight  Estimated Nutritional Needs: Kcal: 1400 - 1600 Protein: 65 - 75 g Fluid: approx 1.5 liters daily  Skin: intact  Diet Order: NPO  EDUCATION NEEDS: -No education needs identified at this time   Intake/Output Summary (Last 24 hours) at 11/06/13 1148 Last data filed at 11/06/13 1000  Gross per 24 hour  Intake 761.25 ml  Output     15 ml  Net 746.25 ml    Last BM: 2/8  Labs:   Recent Labs Lab 11/05/13 1948 11/06/13 0532  NA 138 138  K 3.6* 4.5  CL 94* 96  CO2 30 27  BUN 37* 35*  CREATININE 1.51* 1.47*  CALCIUM 10.8* 10.3  GLUCOSE 100* 189*    CBG (last 3)  No results found for this basename: GLUCAP,  in the last 72 hours  Scheduled Meds: . azithromycin (ZITHROMAX) 500 MG IVPB  500 mg Intravenous Q24H  . gabapentin  400 mg Oral BID  .  heparin  5,000 Units Subcutaneous Q8H  . ipratropium-albuterol  3 mL Nebulization Q6H  . morphine      . piperacillin-tazobactam (ZOSYN)  IV  2.25 g Intravenous Q8H  . predniSONE  40 mg Oral Q breakfast  . sodium chloride  3 mL Intravenous Q12H    Continuous Infusions: . dextrose 5 % and 0.9% NaCl 75 mL/hr at 11/06/13 1017    Past Medical History  Diagnosis Date  . Peripheral arterial disease   . Ulcer     Non-healing ischemic ulcer RIGHT LEG  . Marginal zone lymphoma, spleen   . Macular degeneration (senile) of retina, unspecified   . Legal blindness, as defined in [place]   .  Polyneuropathy in diabetes(357.2)   . Brachial neuritis or radiculitis NOS   . COPD (chronic obstructive pulmonary disease)   . Chronic kidney disease     stage III  . Abnormality of gait   . Hypertonicity of bladder     Past Surgical History  Procedure Laterality Date  . Eye surgery Bilateral     cataract    Inda Coke MS, RD, LDN Pager: [phone] After-hours pager: [phone]

## 2013-11-06 NOTE — Significant Event (Signed)
Rapid Response Event Note  Overview:  Called to assist with patient with RR distress and low O2 sats.   Time Called: 0738 Arrival Time: 0742 Event Type: Respiratory  Initial Focused Assessment:  On arrival patient easily aroused - able to answer questions broken sentences - hard of hearing - restless - denies pain but endorses SOB - bil BS present - little air movement - rhonchi present bilaterally - skin hot to touch -dry - moves all extemities - follows commands - BP 160/71  HR 124 - SR with some PAC and PVC present -  Rectal temp 101.1  RR 42 - using accessory muscles.  O2 sat 94% on 6 liter nasal cannula.   Interventions:  Repositioned in bed - placed on NRB mask - IV fluids decreased to [place] - suction set up to room - MD Dr. Ronnald Ramp present - stat PCXR done - duoneb given.   Dr. Eulas Post and Dr. Eppie Gibson present - Morphine 1 mg IV given.   Patient remains with increased WOB - some increased air movement - RT Katey present - ABG done - call for BiPap.  Repeat VS = 117/67 HR 124 RR 38-42 O2 sats 1--% NRB mask.  Placed on Bipap at [place] per [facility] RN = 50% FiO2 with 12/6 - tol well = increased to 14/6 for low volumes.  Critical ABG reported to Dr. Ronnald Ramp who is present in room.  Patient now resting with comfort of Bipap = still with RR 30 but decreased WOB.  Blood cultures done.  118/68  HR 103  RR 26 with Bipap - transferred to 2M15 with Bipap and monitor.  Patient tol well.  Handoff to [employer].     Event Summary: Name of Physician Notified:  Dr. Natasha Bence   Dr. Clayburn Pert  Dr. Oval Linsey at  (PTA  RRT )    at    Outcome: Transferred (Comment) (SDU for BiPap - ICU overflow)     Quin Hoop

## 2013-11-06 NOTE — Progress Notes (Signed)
RT approached by Rapid Response RN in hallway who had neb treatment in hand and asked me to assess patient. RT came in with MD and bedside RN at bedside. Pt very diminished breath sounds with few exp wheezes and few rales. Awaiting other orders. RT will continue to monitor.

## 2013-11-06 NOTE — Progress Notes (Signed)
Subjective:  Patient seen at bedside this AM, paged by nurse for decrease SpO2 on 6L [place]. Patient tachypneic on exam, agitated, appeared air hungry. Rectal temperature ~101 F. New portable CXR showed new right RLL infiltrate, likely 2/2 aspiration.  Significant increase in WBC's overnight, likely not solely d/t steroids.  ABG drawn during episode of respiratory distress on [place]-mask, prior to BiPAP; pH 7.17, pCO2 80, pO2 255, HCO3 27.5. Given 1 mg Morphine for air hunger, placed on BiPAP, much improved, patient appeared comfortable in bed, NAD.  Seen in the afternoon, off BiPAP, satting in the 88-92% range, comfortable on [place].   Objective: Vital signs in last 24 hours: Filed Vitals:   11/06/13 1200 11/06/13 1205 11/06/13 1215 11/06/13 1227  BP: 83/70  100/86   Pulse: 52  25   Temp:    97 F (36.1 C)  TempSrc:    Oral  Resp: 25  26   Height:      Weight:      SpO2: 100% 100% 100%    Weight change:   Intake/Output Summary (Last 24 hours) at 11/06/13 1241 Last data filed at 11/06/13 1200  Gross per 24 hour  Intake 1161.25 ml  Output     15 ml  Net 1146.25 ml   Physical Exam: General: Awake, alert, NAD. On [place], able to answer questions appropriately. HEENT: Legal blindness. Normocephalic atraumatic. Neck: Full range of motion without pain, supple, no lymphadenopathy. Lungs: Improved air entry bilaterally, mild coarse breath sounds diffusely, rhonchi and faint wheezing in the lung bases.  Heart: Tachycardic, regular rhythm, no murmurs, gallops, or rubs Abdomen: Soft, non-tender, non-distended, normal bowel sounds Extremities: No cyanosis, clubbing, or edema Neurologic: Awake and alert, moves all extremities spontaneously. No focal motor or sensory abnormalities.   Lab Results: Basic Metabolic Panel:  Recent Labs Lab 11/05/13 1948 11/06/13 0532  NA 138 138  K 3.6* 4.5  CL 94* 96  CO2 30 27  GLUCOSE 100* 189*  BUN 37* 35*  CREATININE 1.51* 1.47*  CALCIUM 10.8* 10.3     Liver Function Tests:  Recent Labs Lab 11/05/13 1948  AST 24  ALT 22  ALKPHOS 57  BILITOT 0.3  PROT 5.7*  ALBUMIN 3.3*   CBC:  Recent Labs Lab 11/05/13 1948 11/06/13 0532  WBC 6.2 24.2*  NEUTROABS 2.7  --   HGB 9.1* 9.7*  HCT 27.9* 30.3*  MCV 73.0* 74.1*  PLT 90* 137*   Cardiac Enzymes:  Recent Labs Lab 11/06/13 0532  CKTOTAL 198*   CBG:  Recent Labs Lab 11/06/13 1126  GLUCAP 148*   Urinalysis:  Recent Labs Lab 11/05/13 2149 11/06/13 0952  COLORURINE YELLOW YELLOW  LABSPEC 1.019 1.019  PHURINE 5.5 5.0  GLUCOSEU NEGATIVE NEGATIVE  HGBUR MODERATE* NEGATIVE  BILIRUBINUR NEGATIVE NEGATIVE  KETONESUR NEGATIVE NEGATIVE  PROTEINUR NEGATIVE 30*  UROBILINOGEN 0.2 0.2  NITRITE NEGATIVE NEGATIVE  LEUKOCYTESUR MODERATE* NEGATIVE   Studies/Results: Dg Chest Port 1 View  11/06/2013   CLINICAL DATA:  Respiratory distress  EXAM: PORTABLE CHEST - 1 VIEW  COMPARISON:  11/01/2013  FINDINGS: Cardiac shadow is stable. The lungs are well aerated bilaterally. New patchy infiltrative density is noted in the right mid and lower lung field. No pneumothorax or pleural effusion is seen. The left lung remains clear. Calcified granuloma is again noted in the left lung base.  IMPRESSION: New patchy infiltrate in the right mid and lower lung field. Continued followup is recommended.   Electronically Signed   By: Elta Guadeloupe  Lukens M.D.   On: 11/06/2013 08:04   Medications: I have reviewed the patient's current medications. Scheduled Meds: . azithromycin (ZITHROMAX) 500 MG IVPB  500 mg Intravenous Q24H  . gabapentin  400 mg Oral BID  . heparin  5,000 Units Subcutaneous Q8H  . ipratropium-albuterol  3 mL Nebulization Q6H  . morphine      . piperacillin-tazobactam (ZOSYN)  IV  2.25 g Intravenous Q8H  . predniSONE  40 mg Oral Q breakfast  . sodium chloride  3 mL Intravenous Q12H   Continuous Infusions: . dextrose 5 % and 0.9% NaCl 75 mL/hr at 11/06/13 1017   PRN  Meds:.ipratropium-albuterol, morphine injection  Assessment/Plan:  [name] is a 80 y.o. female w/ PMHx of COPD, PAD, neuropathy, CKD stage III, and legal blindness, admitted for suspected COPD exacerbation, found to have new RLL infiltrate, suspected CAP vs aspiration pneumonia.  CAP vs aspiration pneumonia- Patient presented w/ findings suggestive of COPD exacerbation as described below. This AM (2/10), patient became hypoxic, SpO2 decreased to 80's on 6L O2 via [place], accompanied by tachypnea, tachycardia, agitation and air hunger, suggesting hypercapneic respiratory failure. Lung exam w/ decreased air entry diffusely. Rectal temperature ~101 F. On admission, WBC's 6.2, this AM increased to 24.2 (started on Prednisone overnight for COPD, however, this unlikely to cause this significant a rise in [facility]). Placed on high flow O2 via mask, w/ increase in SpO2, however, patient still very agitated and tachypneic. Given 1 mg Morphine, w/ minimal relief of agitation. Portable CXR performed, showed new RLL infiltrate. Given duoneb treatment still with minimal relief. ABG performed, showed 7.17/80/255/27.5/100%, suggesting significant respiratory acidosis. Placed on BiPAP w/ clinical improvement; patient appeared more comfortable on exam. Seen in afternoon, comfortable on O2 via [place]. Satting 88-92%. - Upgraded to stepdown (ICU bed only available). May be able to move back to med surg in AM - Started on CAP + Aspiration coverage; Azithromycin + Zosyn - NPO while requiring BiPAP - Continue BiPAP as needed, SpO2 goal >88%. - Blood cultures x2 ordered - Trend CBC - Duonebs as below - Morphine 1 mg qh1 prn for air hunger - SLP eval once off BiPAP  COPD exacerbation- SOB, cough, and hypoxia at home, w/ h/o COPD, possibly contributed to by viral URI. Previous CXR and CBC not initially suggestive of active infective/bacterial process. PE unlikely. - Prednisone- 40mg  daily started on admission. Will hold  for now given likely active pneumonia. - Duonebs- Q2H PRN, Q6H.  - BiPAP prn  Generalized Weakness- Likely due to diarrhea, as per [facility] from [facility], pt has chronic diarrhea, but pt says this has resolved. BUN and Cr- has improved compared to values gotten at [facility]- 2/5/204- BUN- was 48, Cr- was 2.02.  - Cont D5 NS at 92ml/hr  - BMET in the AM  - UA negative for UTI  Electrolyte abnormality- Mild hypokalemia- Resolved. K 4.5 on BMET this AM - Continue to follow  Diabetic Polyneuropathy- Not on any hypoglycemic agent.  - Cont Gabapentin- 400mg  BID.   DVT PPx- Heparin 5000 Q8H.   CODE- DNR/DNI, as per pts wishes.   Dispo: Disposition is deferred at this time, awaiting improvement of current medical problems.  Anticipated discharge in approximately 2-3 day(s).   The patient does have a current PCP Janifer Adie, MD) and does need an [facility] hospital follow-up appointment after discharge.  The patient does not have transportation limitations that hinder transportation to clinic appointments.  .Services Needed at time of discharge: Y =  Yes, Blank = No PT:   OT:   RN:   Equipment:   Other:     LOS: 1 day   Corky Sox, MD 11/06/2013, 12:41 PM

## 2013-11-06 NOTE — Progress Notes (Signed)
Patient transported to room 60m15 by rapid response nurse, NT and RT. Joslyn Hy, MSN, RN, [employer]

## 2013-11-06 NOTE — Progress Notes (Signed)
Called report to [facility] RN. Joslyn Hy, MSN, RN, [employer]

## 2013-11-07 DIAGNOSIS — D509 Iron deficiency anemia, unspecified: Secondary | ICD-10-CM | POA: Diagnosis present

## 2013-11-07 DIAGNOSIS — D696 Thrombocytopenia, unspecified: Secondary | ICD-10-CM | POA: Diagnosis present

## 2013-11-07 LAB — CBC
HCT: 23.7 % — ABNORMAL LOW (ref 36.0–46.0)
HCT: 27.8 % — ABNORMAL LOW (ref 36.0–46.0)
HEMOGLOBIN: 8.7 g/dL — AB (ref 12.0–15.0)
Hemoglobin: 7.5 g/dL — ABNORMAL LOW (ref 12.0–15.0)
MCH: 23.7 pg — AB (ref 26.0–34.0)
MCH: 23.6 pg — AB (ref 26.0–34.0)
MCHC: 31.6 g/dL (ref 30.0–36.0)
MCHC: 31.3 g/dL (ref 30.0–36.0)
MCV: 75 fL — ABNORMAL LOW (ref 78.0–100.0)
MCV: 75.3 fL — ABNORMAL LOW (ref 78.0–100.0)
PLATELETS: 83 10*3/uL — AB (ref 150–400)
Platelets: 69 10*3/uL — ABNORMAL LOW (ref 150–400)
RBC: 3.16 MIL/uL — ABNORMAL LOW (ref 3.87–5.11)
RBC: 3.69 MIL/uL — AB (ref 3.87–5.11)
RDW: 16 % — AB (ref 11.5–15.5)
RDW: 16.1 % — ABNORMAL HIGH (ref 11.5–15.5)
WBC: 6.4 10*3/uL (ref 4.0–10.5)
WBC: 9 10*3/uL (ref 4.0–10.5)

## 2013-11-07 LAB — CBC WITH DIFFERENTIAL/PLATELET
Basophils Absolute: 0 10*3/uL (ref 0.0–0.1)
Basophils Relative: 0 % (ref 0–1)
Eosinophils Absolute: 0 10*3/uL (ref 0.0–0.7)
Eosinophils Relative: 0 % (ref 0–5)
HCT: 22.2 % — ABNORMAL LOW (ref 36.0–46.0)
Hemoglobin: 7 g/dL — ABNORMAL LOW (ref 12.0–15.0)
Lymphocytes Relative: 55 % — ABNORMAL HIGH (ref 12–46)
Lymphs Abs: 3 10*3/uL (ref 0.7–4.0)
MCH: 23.5 pg — ABNORMAL LOW (ref 26.0–34.0)
MCHC: 31.5 g/dL (ref 30.0–36.0)
MCV: 74.5 fL — ABNORMAL LOW (ref 78.0–100.0)
Monocytes Absolute: 0.2 10*3/uL (ref 0.1–1.0)
Monocytes Relative: 4 % (ref 3–12)
Neutro Abs: 2.2 10*3/uL (ref 1.7–7.7)
Neutrophils Relative %: 40 % — ABNORMAL LOW (ref 43–77)
Platelets: 70 10*3/uL — ABNORMAL LOW (ref 150–400)
RBC: 2.98 MIL/uL — ABNORMAL LOW (ref 3.87–5.11)
RDW: 16.2 % — ABNORMAL HIGH (ref 11.5–15.5)
WBC: 5.4 10*3/uL (ref 4.0–10.5)

## 2013-11-07 LAB — URINE CULTURE

## 2013-11-07 LAB — IRON AND TIBC
IRON: 12 ug/dL — AB (ref 42–135)
Saturation Ratios: 5 % — ABNORMAL LOW (ref 20–55)
TIBC: 227 ug/dL — ABNORMAL LOW (ref 250–470)
UIBC: 215 ug/dL (ref 125–400)

## 2013-11-07 LAB — LEGIONELLA ANTIGEN, URINE: Legionella Antigen, Urine: NEGATIVE

## 2013-11-07 LAB — BASIC METABOLIC PANEL
BUN: 36 mg/dL — AB (ref 6–23)
CO2: 27 meq/L (ref 19–32)
CREATININE: 1.79 mg/dL — AB (ref 0.50–1.10)
Calcium: 9.2 mg/dL (ref 8.4–10.5)
Chloride: 99 mEq/L (ref 96–112)
GFR calc Af Amer: 27 mL/min — ABNORMAL LOW (ref >90)
GFR, EST NON AFRICAN AMERICAN: 23 mL/min — AB (ref >90)
GLUCOSE: 108 mg/dL — AB (ref 70–99)
Potassium: 3.9 mEq/L (ref 3.7–5.3)
Sodium: 139 mEq/L (ref 137–147)

## 2013-11-07 LAB — GLUCOSE, CAPILLARY
GLUCOSE-CAPILLARY: 112 mg/dL — AB (ref 70–99)
Glucose-Capillary: 114 mg/dL — ABNORMAL HIGH (ref 70–99)
Glucose-Capillary: 97 mg/dL (ref 70–99)
Glucose-Capillary: 93 mg/dL (ref 70–99)
Glucose-Capillary: 126 mg/dL — ABNORMAL HIGH (ref 70–99)
Glucose-Capillary: 109 mg/dL — ABNORMAL HIGH (ref 70–99)

## 2013-11-07 LAB — HEMOGLOBIN A1C
Hgb A1c MFr Bld: 5.2 % (ref <5.7)
Mean Plasma Glucose: 103 mg/dL (ref <117)

## 2013-11-07 LAB — FERRITIN: Ferritin: 136 ng/mL (ref 10–291)

## 2013-11-07 MED ORDER — MAGNESIUM SULFATE 40 MG/ML IJ SOLN
2.0000 g | Freq: Once | INTRAMUSCULAR | Status: AC
  Administered 2013-11-07: 2 g via INTRAVENOUS
  Filled 2013-11-07 (×2): qty 50

## 2013-11-07 MED ORDER — IPRATROPIUM-ALBUTEROL 0.5-2.5 (3) MG/3ML IN SOLN
3.0000 mL | RESPIRATORY_TRACT | Status: DC
  Administered 2013-11-07 – 2013-11-09 (×11): 3 mL via RESPIRATORY_TRACT
  Filled 2013-11-07 (×11): qty 3

## 2013-11-07 MED ORDER — INSULIN ASPART 100 UNIT/ML ~~LOC~~ SOLN
0.0000 [IU] | Freq: Three times a day (TID) | SUBCUTANEOUS | Status: DC
  Administered 2013-11-08 – 2013-11-09 (×2): 1 [IU] via SUBCUTANEOUS

## 2013-11-07 NOTE — Progress Notes (Signed)
Internal Medicine Attending  Date: 11/07/2013  Patient name: Elizabeth Holder Medical record number: [id number] Date of birth: 1917-08-06 Age: 80 y.o. Gender: female  I saw and evaluated the patient. I reviewed the resident's note by Dr. Ronnald Ramp and I agree with the resident's findings and plans as documented in his progress note.  [name] pulmonary status is improved over the last 24 hours but remains tenuous.  Aspiration was confirmed on swallowing evaluation and her aspiration pneumonia is being treated with Zosyn and Azithromycin.  Examination reveals continued diffuse expiratory and inspiratory wheezes but with improved air movement from previously.  The cause of the acute anemia is unclear and we will guaiac stools and send for a type and screen.  If necessary, and with her permission, will transfuse for a Hgb < 7.0.  We will keep her in the step down unit for an additional day for closer monitoring and more intense nursing care.

## 2013-11-07 NOTE — Care Management Note (Signed)
    Page 1 of 1   11/07/2013     2:00:08 PM   CARE MANAGEMENT NOTE 11/07/2013  Patient:  Elizabeth Holder, Elizabeth Holder   Account Number:  [id number]  Date Initiated:  11/06/2013  Documentation initiated by:  [facility]  Subjective/Objective Assessment:   Admitted with resp failure.     Action/Plan:   Anticipated DC Date:  11/09/2013   Anticipated DC Plan:  [place]  CM consult      Choice offered to / List presented to:             Status of service:  In process, will continue to follow Medicare Important Message given?   (If response is "NO", the following Medicare IM given date fields will be blank) Date Medicare IM given:   Date Additional Medicare IM given:    Discharge Disposition:    Per UR Regulation:  Reviewed for med. necessity/level of care/duration of stay  If discussed at [place] of Stay Meetings, dates discussed:    Comments:  Contact:  Reiser,Kate Grandaughter [phone]                 Olga Coaster [phone]   [phone]                  Reiser,Sandy Other     [phone]  11-07-13 1:45pm Luz Lex, RNBSN [phone] Went into room to talk with patient about needs at home. Pateint plainly tells me she is not going to be going home. I asked patient where she was going and she states she is going to die.  She states she is 80 years old she has done everything she wants she is done with all this.  States she has talked with her family about this already.  States family would like to know where to go to start the process for cremation as that is what she would like.  Adviced to contact a funeral home with her wishes and they can assist or refer.  11-06-13 2pm Luz Lex, RNBSN [phone] Per grandaughter who she lives with.  [facility] days and grandaughter is with her in evenings and on weekends.

## 2013-11-07 NOTE — Progress Notes (Signed)
1745 Report called to receiving nurse Chester Holstein, RN. Past medical history and present hospitalization course reported. All patient belongings transported with patient via wheelchair to [place]. [facility] Proxy) Roseanna Rainbow [phone] notified of transfer to [phone] via phone.

## 2013-11-07 NOTE — Evaluation (Signed)
Clinical/Bedside Swallow Evaluation Patient Details  Name: Elizabeth Holder MRN: [id number] Date of Birth: 1917-04-28  Today's Date: 11/07/2013 Time: 1410-1430 SLP Time Calculation (min): 20 min  Past Medical History:  Past Medical History  Diagnosis Date  . Peripheral arterial disease   . Ulcer     Non-healing ischemic ulcer RIGHT LEG  . Marginal zone lymphoma, spleen   . Macular degeneration (senile) of retina, unspecified   . Legal blindness, as defined in [place]   . Polyneuropathy in diabetes(357.2)   . Brachial neuritis or radiculitis NOS   . COPD (chronic obstructive pulmonary disease)   . Chronic kidney disease     stage III  . Abnormality of gait   . Hypertonicity of bladder    Past Surgical History:  Past Surgical History  Procedure Laterality Date  . Eye surgery Bilateral     cataract   HPI:  80 y.o. female w/ PMHx of COPD, PAD, neuropathy, CKD stage III, and legal blindness, admitted for suspected COPD exacerbation, found to have new RLL infiltrate, suspected CAP vs aspiration pneumonia.   Assessment / Plan / Recommendation Clinical Impression  Pt presents with a dysphagia related to impaired respiratory-swallowing coordination.  Pt asserted "I can't eat when I can't swallow."  When RR reached high 20s-low 30s, she exhibited oral holding of materials, delayed swallow, and intermittent cough after consumption of thin liquids.  Pt desaturated into the 80s intermittently throughout assessment.  Symptoms of dysphagia diminished when RR decreased.    Pt is likely intermittently aspirating liquids - we discussed aspiration, basic methods to improve safety when drinking.  She voiced her desire to eat and drink per her preferences.  Pt continuously spoke of being near "the end," meeting with her family tonight to [place] with her daughter in [place] to make decisions.  She stated "we have to talk about it since there is no Dr. Zettie Pho here"  and repeatedly stated that she  wants to be lucid when she makes her decisions.   Spoke with Dr. Ronnald Ramp re: assessment and pt's expression of her wishes.   Recommend identification of goals of care between pt and MD.  If decision is made to resume POs, recommend dysphagia 3 and thin liquids; meds whole with puree.  No further SLP needs identified - will sign off.    Aspiration Risk  Moderate    Diet Recommendation Dysphagia 3 (Mechanical Soft);Thin liquid   Liquid Administration via: Cup;Straw Medication Administration: Whole meds with puree Supervision: Patient able to self feed Compensations: Slow rate;Small sips/bites    Other  Recommendations Oral Care Recommendations: Oral care BID   Follow Up Recommendations  None            SLP Swallow Goals     Swallow Study Prior Functional Status       General Date of Onset: 11/05/13 HPI: 80 y.o. female w/ PMHx of COPD, PAD, neuropathy, CKD stage III, and legal blindness, admitted for suspected COPD exacerbation, found to have new RLL infiltrate, suspected CAP vs aspiration pneumonia. Type of Study: Bedside swallow evaluation Previous Swallow Assessment: none per records Diet Prior to this Study: NPO Temperature Spikes Noted: No Respiratory Status: Nasal cannula Behavior/Cognition: Alert;Cooperative;Pleasant mood Oral Cavity - Dentition: Edentulous Self-Feeding Abilities: Needs set up Patient Positioning: Upright in bed Baseline Vocal Quality: Clear Volitional Cough: Strong Volitional Swallow: Able to elicit    Oral/Motor/Sensory Function Overall Oral Motor/Sensory Function: Appears within functional limits for tasks assessed   Ice Chips Ice chips: Within  functional limits   Thin Liquid Thin Liquid: Impaired Presentation: Cup;Straw Oral Phase Functional Implications: Oral holding Pharyngeal  Phase Impairments: Suspected delayed Swallow;Cough - Immediate but intermittent   Nectar Thick Nectar Thick Liquid: Not tested   Honey Thick Honey Thick Liquid:  Not tested   Puree Puree: Within functional limits Presentation: [place]. [place], [place] CCC/SLP Pager [phone]     Solid: Not tested - pt declined further POs      Juan Quam Laurice 11/07/2013,2:54 PM

## 2013-11-07 NOTE — Progress Notes (Signed)
Subjective:  Patient seen at bedside this AM. Much improved clinically. Not requiring BiPAP, however, was complaining of SOB and dyspnea this morning. Improved after breathing treatment. No fever, chills, nausea, vomiting, chest pain. Lung exam w/ significant coarse breath sounds and wheezing diffusely. Improved air entry.  Objective: Vital signs in last 24 hours: Filed Vitals:   11/07/13 0600 11/07/13 0700 11/07/13 0800 11/07/13 0804  BP: 88/43 80/35 90/46    Pulse: 61 31 71   Temp:      TempSrc:      Resp: 22 28 28    Height:      Weight:      SpO2: 98% 96% 98% 99%   Weight change: 2 lb 15.6 oz (1.35 kg)  Intake/Output Summary (Last 24 hours) at 11/07/13 0859 Last data filed at 11/07/13 0700  Gross per 24 hour  Intake   1891 ml  Output    621 ml  Net   1270 ml   Physical Exam: General: Awake, alert, NAD. On [place], able to answer questions appropriately. HEENT: Legal blindness. Normocephalic atraumatic. Neck: Full range of motion without pain, supple, no lymphadenopathy. Lungs: Improved air entry bilaterally, coarse breath sounds and wheezing diffusely. Heart: Tachycardic, regular rhythm, no murmurs, gallops, or rubs Abdomen: Soft, non-tender, non-distended, normal bowel sounds Extremities: No cyanosis, clubbing, or edema Neurologic: Awake and alert, moves all extremities spontaneously. No focal motor or sensory abnormalities.   Lab Results: Basic Metabolic Panel:  Recent Labs Lab 11/06/13 0532 11/06/13 1910 11/07/13 0210  NA 138  --  139  K 4.5  --  3.9  CL 96  --  99  CO2 27  --  27  GLUCOSE 189*  --  108*  BUN 35*  --  36*  CREATININE 1.47*  --  1.79*  CALCIUM 10.3  --  9.2  MG  --  1.5  --    Liver Function Tests:  Recent Labs Lab 11/05/13 1948  AST 24  ALT 22  ALKPHOS 57  BILITOT 0.3  PROT 5.7*  ALBUMIN 3.3*   CBC:  Recent Labs Lab 11/05/13 1948 11/06/13 0532 11/07/13 0210  WBC 6.2 24.2* 5.4  NEUTROABS 2.7  --  2.2  HGB 9.1* 9.7* 7.0*    HCT 27.9* 30.3* 22.2*  MCV 73.0* 74.1* 74.5*  PLT 90* 137* 70*   Cardiac Enzymes:  Recent Labs Lab 11/06/13 0532  CKTOTAL 198*   CBG:  Recent Labs Lab 11/06/13 1126 11/06/13 1538 11/06/13 1908 11/07/13 0030 11/07/13 0446  GLUCAP 148* 93 119* 114* 112*   Urinalysis:  Recent Labs Lab 11/05/13 2149 11/06/13 0952  COLORURINE YELLOW YELLOW  LABSPEC 1.019 1.019  PHURINE 5.5 5.0  GLUCOSEU NEGATIVE NEGATIVE  HGBUR MODERATE* NEGATIVE  BILIRUBINUR NEGATIVE NEGATIVE  KETONESUR NEGATIVE NEGATIVE  PROTEINUR NEGATIVE 30*  UROBILINOGEN 0.2 0.2  NITRITE NEGATIVE NEGATIVE  LEUKOCYTESUR MODERATE* NEGATIVE   Studies/Results: Dg Chest Port 1 View  11/06/2013   CLINICAL DATA:  Respiratory distress  EXAM: PORTABLE CHEST - 1 VIEW  COMPARISON:  11/01/2013  FINDINGS: Cardiac shadow is stable. The lungs are well aerated bilaterally. New patchy infiltrative density is noted in the right mid and lower lung field. No pneumothorax or pleural effusion is seen. The left lung remains clear. Calcified granuloma is again noted in the left lung base.  IMPRESSION: New patchy infiltrate in the right mid and lower lung field. Continued followup is recommended.   Electronically Signed   By: Inez Catalina M.D.   On: 11/06/2013  08:04   Medications: I have reviewed the patient's current medications. Scheduled Meds: . azithromycin (ZITHROMAX) 500 MG IVPB  500 mg Intravenous Q24H  . gabapentin  400 mg Oral BID  . heparin  5,000 Units Subcutaneous 3 times per day  . insulin aspart  0-9 Units Subcutaneous 6 times per day  . ipratropium-albuterol  3 mL Nebulization Q6H  . magnesium sulfate 1 - 4 g bolus IVPB  2 g Intravenous Once  . piperacillin-tazobactam (ZOSYN)  IV  2.25 g Intravenous 3 times per day  . sodium chloride  3 mL Intravenous Q12H   Continuous Infusions: . dextrose 5 % and 0.9% NaCl 75 mL/hr at 11/07/13 0700   PRN Meds:.ipratropium-albuterol, morphine injection  Assessment/Plan:  [name] is a 80 y.o. female w/ PMHx of COPD, PAD, neuropathy, CKD stage III, and legal blindness, admitted for COPD exacerbation, found to have new RLL infiltrate, suspected CAP vs aspiration pneumonia.  CAP vs aspiration pneumonia- Patient presented w/ findings suggestive of COPD exacerbation, however, yesterday (2/10), patient became hypoxic, requiring BiPAP. CXR yesterday showed new RLL/ML infiltrate in combination w/ leukocytosis of 24.  Upgraded to stepdown, started on empiric antibiotics for CAP/aspiration, more likely the latter. Improved this AM, only requiring [place], SpO2 88-95%. No longer agitated, leukocytosis resolved, 6.4 this AM.  - Keep in Stepdown until AM - Continue BiPAP as needed, SpO2 goal >88%. - Continue Azithromycin + Zosyn - Blood cultures pending - Continue to trend CBC - Duonebs increased to q4h + q2h prn - Morphine 1 mg qh1 prn for air hunger - NPO for now, on D5NS, will likely restart diet in PM as per patient's wishes. Seen by SLP today, patient w/ intermittent aspiration 2/2 respiratory status. Will reassess patient's goals of care later today including risk vs benefit of restarting diet and risk of aspiration.   COPD exacerbation- SOB, cough, and hypoxia at home, w/ h/o COPD, possibly contributed to by viral URI. Previous CXR and CBC not initially suggestive of active infective/bacterial process. - Duonebs- Q2H PRN, Q6H.  - BiPAP prn  Generalized Weakness- Likely due to diarrhea, as [facility], pt has chronic diarrhea, but pt says this has resolved. - Cont D5 NS at 77ml/hr for now.  - Will likely restart diet in PM as discussed above. Need to reassess goals of care involving risks of aspiration w/ restarting diet.   Hypokalemia- Resolved.  - Repeat BMP in AM  Diabetic Polyneuropathy- Not on any hypoglycemic agent.  - Cont Gabapentin 400 mg bid.   DVT PPx- SCD's; hold Heparin [place] given low platelets this AM.   CODE- DNR/DNI, as per pts wishes.    Dispo: Disposition is deferred at this time, awaiting improvement of current medical problems.  Anticipated discharge in approximately 1-2 day(s).   The patient does have a current PCP Elizabeth Adie, MD) and does need an [facility] hospital follow-up appointment after discharge.  The patient does not have transportation limitations that hinder transportation to clinic appointments.  .Services Needed at time of discharge: Y = Yes, Blank = No PT:   OT:   RN:   Equipment:   Other:     LOS: 2 days   Corky Sox, MD 11/07/2013, 8:59 AM

## 2013-11-08 DIAGNOSIS — R82998 Other abnormal findings in urine: Secondary | ICD-10-CM

## 2013-11-08 LAB — GLUCOSE, CAPILLARY
GLUCOSE-CAPILLARY: 121 mg/dL — AB (ref 70–99)
GLUCOSE-CAPILLARY: 108 mg/dL — AB (ref 70–99)
Glucose-Capillary: 110 mg/dL — ABNORMAL HIGH (ref 70–99)
Glucose-Capillary: 112 mg/dL — ABNORMAL HIGH (ref 70–99)

## 2013-11-08 MED ORDER — AMOXICILLIN-POT CLAVULANATE 500-125 MG PO TABS
1.0000 | ORAL_TABLET | Freq: Two times a day (BID) | ORAL | Status: DC
  Administered 2013-11-08 – 2013-11-09 (×3): 500 mg via ORAL
  Filled 2013-11-08 (×5): qty 1

## 2013-11-08 MED ORDER — AMOXICILLIN-POT CLAVULANATE 500-125 MG PO TABS
1.0000 | ORAL_TABLET | Freq: Three times a day (TID) | ORAL | Status: DC
  Filled 2013-11-08 (×3): qty 1

## 2013-11-08 NOTE — Progress Notes (Signed)
CSW spoke with Audrea Muscat at [place] program [phone] regarding patient discharge to SNF. Audrea Muscat confirmed that patient will be discharged to [facility], and that [facility] social worker Raquel Sarna [phone] will be assisting with placement. CSW will continue to follow for discharge planning to SNF.  Tilden Fossa, MSW, [place] Clinical Social Worker [facility] Emergency Dept. [phone]

## 2013-11-08 NOTE — Progress Notes (Signed)
This pt was on my [facility] list, but transferred to 6N. CSW called the CSW covering for unit CSW and provided a handoff. This CSW signing off, and covering CSW to pick up case and assist with discharge to [place], per CSW consult.   Ky Barban, MSW, [facility] Clinical Social Worker [phone]

## 2013-11-08 NOTE — Progress Notes (Signed)
Subjective:  Patient seen at bedside this AM. Improving significantly. No fever or chills overnight, no need for BiPAP. SpO2>90% on [place]. Still with coarse breath sounds and wheezes bilaterally.   Objective: Vital signs in last 24 hours: Filed Vitals:   11/08/13 0500 11/08/13 0600 11/08/13 0738 11/08/13 0800  BP: 161/53 146/85 151/80 177/128  Pulse: 69 83 147 115  Temp: 98 F (36.7 C)  98.4 F (36.9 C)   TempSrc: Oral  Oral   Resp: 29 31 30 20   Height:      Weight:      SpO2: 97% 98% 94% 90%   Weight change:   Intake/Output Summary (Last 24 hours) at 11/08/13 0940 Last data filed at 11/08/13 0800  Gross per 24 hour  Intake   2425 ml  Output    160 ml  Net   2265 ml   Physical Exam: General: Elderly female, NAD. Alert, cooperative with exam. HEENT: Legal blindness. Normocephalic atraumatic. Neck: Full range of motion without pain, supple, no lymphadenopathy. Lungs: Improved air entry bilaterally, coarse breath sounds and wheezing diffusely. Heart: Tachycardic, regular rhythm, no murmurs, gallops, or rubs Abdomen: Soft, non-tender, non-distended, normal bowel sounds Extremities: No cyanosis, clubbing, or edema Neurologic: Awake and alert, moves all extremities spontaneously. No focal motor or sensory abnormalities.   Lab Results: Basic Metabolic Panel:  Recent Labs Lab 11/06/13 0532 11/06/13 1910 11/07/13 0210  NA 138  --  139  K 4.5  --  3.9  CL 96  --  99  CO2 27  --  27  GLUCOSE 189*  --  108*  BUN 35*  --  36*  CREATININE 1.47*  --  1.79*  CALCIUM 10.3  --  9.2  MG  --  1.5  --    Liver Function Tests:  Recent Labs Lab 11/05/13 1948  AST 24  ALT 22  ALKPHOS 57  BILITOT 0.3  PROT 5.7*  ALBUMIN 3.3*   CBC:  Recent Labs Lab 11/05/13 1948  11/07/13 0210 11/07/13 0735 11/07/13 1820  WBC 6.2  < > 5.4 6.4 9.0  NEUTROABS 2.7  --  2.2  --   --   HGB 9.1*  < > 7.0* 7.5* 8.7*  HCT 27.9*  < > 22.2* 23.7* 27.8*  MCV 73.0*  < > 74.5* 75.0* 75.3*    PLT 90*  < > 70* 69* 83*  < > = values in this interval not displayed. Cardiac Enzymes:  Recent Labs Lab 11/06/13 0532  CKTOTAL 198*   CBG:  Recent Labs Lab 11/07/13 0446 11/07/13 0755 11/07/13 1234 11/07/13 1539 11/07/13 2112 11/08/13 0741  GLUCAP 112* 97 93 126* 109* 121*   Urinalysis:  Recent Labs Lab 11/05/13 2149 11/06/13 0952  COLORURINE YELLOW YELLOW  LABSPEC 1.019 1.019  PHURINE 5.5 5.0  GLUCOSEU NEGATIVE NEGATIVE  HGBUR MODERATE* NEGATIVE  BILIRUBINUR NEGATIVE NEGATIVE  KETONESUR NEGATIVE NEGATIVE  PROTEINUR NEGATIVE 30*  UROBILINOGEN 0.2 0.2  NITRITE NEGATIVE NEGATIVE  LEUKOCYTESUR MODERATE* NEGATIVE   Medications: I have reviewed the patient's current medications. Scheduled Meds: . azithromycin (ZITHROMAX) 500 MG IVPB  500 mg Intravenous Q24H  . gabapentin  400 mg Oral BID  . insulin aspart  0-9 Units Subcutaneous TID AC & HS  . ipratropium-albuterol  3 mL Nebulization Q4H  . piperacillin-tazobactam (ZOSYN)  IV  2.25 g Intravenous 3 times per day  . sodium chloride  3 mL Intravenous Q12H   Continuous Infusions: . dextrose 5 % and 0.9% NaCl 50  mL/hr at 11/08/13 0400   PRN Meds:.ipratropium-albuterol, morphine injection  Assessment/Plan: Elizabeth Holder is a 80 y.o. female w/ PMHx of COPD, PAD, neuropathy, CKD stage III, and legal blindness, admitted for COPD exacerbation, found to have new RLL infiltrate, suspected CAP vs aspiration pneumonia.  CAP vs aspiration pneumonia- Patient presented w/ findings suggestive of COPD exacerbation, however, on 2/10, patient became hypoxic, requiring BiPAP. CXR showed RLL/ML infiltrate in combination w/ leukocytosis of 24. Since resolved, no further requirement for BiPAP, satting well on O2 via [place].  - Transfer to med-surg - Change antibiotics to Augmentin 500-125 tid; h/o diarrhea w/ Augmentin, however low dose may prevent this. If diarrhea in AM, will change Abx therapy. - Blood cultures negative to  date. - Duonebs q4h + q2h prn - Restarted diet, appetite good. Patient w/ intermittent aspiration on SLP evaluation yesterday, however, per patient's wishes, diet restarted.   COPD exacerbation- Likely contributing to hypoxia initially. Currently satting well on O2 via [place]. - Duonebs- q4h + q2h prn  Asymptomatic Bacteriuria- Patient denies symptoms of dysuria, urgency, and frequency. UA performed on admission, showed moderate leukocytes. Urine culture returned positive for >100,000 colonies E. Coli, pan-sensitive.  - Given absence of symptoms, no further workup  Generalized Weakness- Likely due to diarrhea, as per notes from [facility], pt has chronic diarrhea, but pt says this has resolved. - D/c IVF - Continue dysphagia 3 diet as above.  Diabetic Polyneuropathy- Not on any hypoglycemic agent.  - Cont Gabapentin 400 mg bid.   DVT PPx- SCD's  CODE- DNR/DNI, as per pts wishes.   Dispo: Disposition is deferred at this time, awaiting improvement of current medical problems.  Anticipated discharge in approximately 1-2 day(s).   The patient does have a current PCP Janifer Adie, MD) and does need an [facility] hospital follow-up appointment after discharge.  The patient does not have transportation limitations that hinder transportation to clinic appointments.  .Services Needed at time of discharge: Y = Yes, Blank = No PT:   OT:   RN:   Equipment:   Other:     LOS: 3 days   Corky Sox, MD 11/08/2013, 9:40 AM

## 2013-11-08 NOTE — Progress Notes (Signed)
Internal Medicine Attending  Date: 11/08/2013  Patient name: Elizabeth Holder Medical record number: [id number] Date of birth: [birth year] Age: 80 y.o. Gender: female  I saw and evaluated the patient. I reviewed the resident's note by Dr. Ronnald Ramp and I agree with the resident's findings and plans as documented in his progress note.  Breathing is improved from yesterday.  Have switched to Augmentin orally to treat her aspiration pneumonia.  If blood pressure remains markedly elevated will initiate antihypertensive therapy.  Anticipate transfer to SNF to complete antibiotics in next 24-48 hours.

## 2013-11-08 NOTE — Progress Notes (Signed)
Transferred to 6N rm16 by wheelchair, stable, belongings with family, report given to RN.

## 2013-11-09 DIAGNOSIS — J690 Pneumonitis due to inhalation of food and vomit: Secondary | ICD-10-CM

## 2013-11-09 LAB — GLUCOSE, CAPILLARY
GLUCOSE-CAPILLARY: 127 mg/dL — AB (ref 70–99)
Glucose-Capillary: 117 mg/dL — ABNORMAL HIGH (ref 70–99)

## 2013-11-09 MED ORDER — AMOXICILLIN-POT CLAVULANATE 500-125 MG PO TABS
1.0000 | ORAL_TABLET | Freq: Two times a day (BID) | ORAL | Status: AC
Start: 2013-11-09 — End: ?

## 2013-11-09 MED ORDER — ENSURE COMPLETE PO LIQD
237.0000 mL | Freq: Two times a day (BID) | ORAL | Status: DC
  Administered 2013-11-09 (×2): 237 mL via ORAL

## 2013-11-09 MED ORDER — IPRATROPIUM-ALBUTEROL 0.5-2.5 (3) MG/3ML IN SOLN: 3.0000 mL | RESPIRATORY_TRACT | Status: AC | PRN

## 2013-11-09 NOTE — Progress Notes (Signed)
Clinical Social Work Department CLINICAL SOCIAL WORK PLACEMENT NOTE 11/09/2013  Patient:  MAECY, PODGURSKI  Account Number:  [id number] Admit date:  11/05/2013  Clinical Social Worker:  Pete Pelt, CLINICAL SOCIAL WORKER  Date/time:  11/09/2013 02:24 PM  Clinical Social Work is seeking post-discharge placement for this patient at the following level of care:   SKILLED NURSING   (*CSW will update this form in Epic as items are completed)   11/09/2013  Patient/family provided with [place] Department of Clinical Social Work's list of facilities offering this level of care within the geographic area requested by the patient (or if unable, by the patient's family).  11/09/2013  Patient/family informed of their freedom to choose among providers that offer the needed level of care, that participate in Medicare, Medicaid or managed care program needed by the patient, have an available bed and are willing to accept the patient.  11/09/2013  Patient/family informed of [facility]' ownership interest in [facility], as well as of the fact that they are under no obligation to receive care at this facility.  PASARR submitted to EDS on 11/09/2013 PASARR number received from EDS on 11/09/2013  FL2 transmitted to all facilities in geographic area requested by pt/family on  11/09/2013 FL2 transmitted to all facilities within larger geographic area on 11/09/2013  Patient informed that his/her managed care company has contracts with or will negotiate with  certain facilities, including the following:     Patient/family informed of bed offers received:  11/09/2013 Patient chooses bed at [place] Physician recommends and patient chooses bed at    Patient to be transferred to [place] on  11/09/2013 Patient to be transferred to facility by [facility]  The following physician request were entered in Epic:   Additional  Comments:    [place] 1-16;  6N1-16 Phone: [phone]

## 2013-11-09 NOTE — Progress Notes (Signed)
Report called to RN at [facility]. Room is ready. Await transport via [place]. Charlies Silvers, RN

## 2013-11-09 NOTE — Progress Notes (Signed)
Clinical Social Work Department BRIEF PSYCHOSOCIAL ASSESSMENT 11/09/2013  Patient:  Elizabeth Holder, Elizabeth Holder     Account Number:  [id number]     Admit date:  11/05/2013  Clinical Social Worker:  Pete Pelt, [place]  Date/Time:  11/09/2013 02:13 PM  Referred by:  Physician  Date Referred:  11/08/2013 Referred for  SNF Placement   Other Referral:   Interview type:  Patient Other interview type:    PSYCHOSOCIAL DATA Living Status:  ALONE Admitted from facility:   Level of care:   Primary support name:  Roseanna Rainbow [phone] Primary support relationship to patient:  CHILD, ADULT Degree of support available:   Pt has good support from family    CURRENT CONCERNS Current Concerns  Post-Acute Placement   Other Concerns:    [place] / PLAN CSW met with the Pt at the bedside to discuss d/c planning to facility. CSW introduced self and explained reason for the visit. CSW asked Pt to explain in her own words the reason for the hospitalization. Pt stated that she "can't breath". CSW asked Pt if some of her symptoms were resolved and Pt stated "not much". Pt stated that she is still having difficulty breathing and she hopes it "goes downhill from here." Pt stated that she is going to "[facility] at d/c." Pt also stated that she "is ready to die, whenever it happens." Pt knows several people with her illness and knows "how things go." Pt stated some concerns about "what Helene Kelp could even do to help?" Pt is agreeable to transfering to [facility] today and knows that she would possibly transition over there today. Toward the end of the interaction Pt was asking for more medication and nurse notified. Pt is agreeable to sending information to facility for d/c planning. Rhonda at [place] is agreeable to Pt coming today.   Assessment/plan status:  Information/Referral to [employer] Other assessment/ plan:   Information/referral to community resources:   None needed  at this time as the Pt will go to [place] per [facility].    PATIENT'S/FAMILY'S RESPONSE TO PLAN OF CARE: Pt was appreciative for assistance and is realistic about her problems breathing. Pt would like to transfer today.    CSW will prepare Pt for d/c today.        [facility]  4N 1-16;  [phone] Phone: [phone]

## 2013-11-09 NOTE — Progress Notes (Signed)
Subjective:  Patient seen at bedside this AM. Still complains of SOB, making it difficult to talk and eat she says. SpO2 wnl on 2L via [place]. No fever, chills, nausea, vomiting. She desperately wants to leave the hospital to go to [facility] so she can be comfortable.   Patient to be discharged today.  Objective: Vital signs in last 24 hours: Filed Vitals:   11/09/13 0121 11/09/13 0145 11/09/13 0406 11/09/13 0638  BP: 121/61   137/51  Pulse: 94   93  Temp: 97.8 F (36.6 C)   97.6 F (36.4 C)  TempSrc: Oral   Oral  Resp: 24   20  Height:      Weight:      SpO2: 92% 93% 93% 95%   Weight change:   Intake/Output Summary (Last 24 hours) at 11/09/13 7989 Last data filed at 11/09/13 0559  Gross per 24 hour  Intake   1110 ml  Output      0 ml  Net   1110 ml   Physical Exam: General: Elderly female, NAD. Alert, cooperative with exam. HEENT: Legal blindness. Normocephalic atraumatic. Neck: Full range of motion without pain, supple, no lymphadenopathy. Lungs: Improved air entry bilaterally, still w/ coarse breath sounds and wheezing diffusely. Heart: RRR, no murmurs, gallops, or rubs Abdomen: Soft, non-tender, non-distended, normal bowel sounds Extremities: No cyanosis, clubbing, or edema Neurologic: Awake and alert, moves all extremities spontaneously. No focal motor or sensory abnormalities.   Lab Results: Basic Metabolic Panel:  Recent Labs Lab 11/06/13 0532 11/06/13 1910 11/07/13 0210  NA 138  --  139  K 4.5  --  3.9  CL 96  --  99  CO2 27  --  27  GLUCOSE 189*  --  108*  BUN 35*  --  36*  CREATININE 1.47*  --  1.79*  CALCIUM 10.3  --  9.2  MG  --  1.5  --    Liver Function Tests:  Recent Labs Lab 11/05/13 1948  AST 24  ALT 22  ALKPHOS 57  BILITOT 0.3  PROT 5.7*  ALBUMIN 3.3*   CBC:  Recent Labs Lab 11/05/13 1948  11/07/13 0210 11/07/13 0735 11/07/13 1820  WBC 6.2  < > 5.4 6.4 9.0  NEUTROABS 2.7  --  2.2  --   --   HGB 9.1*  < > 7.0* 7.5* 8.7*    HCT 27.9*  < > 22.2* 23.7* 27.8*  MCV 73.0*  < > 74.5* 75.0* 75.3*  PLT 90*  < > 70* 69* 83*  < > = values in this interval not displayed. Cardiac Enzymes:  Recent Labs Lab 11/06/13 0532  CKTOTAL 198*   CBG:  Recent Labs Lab 11/07/13 2112 11/08/13 0741 11/08/13 1231 11/08/13 1642 11/08/13 2132 11/09/13 0810  GLUCAP 109* 121* 108* 110* 112* 117*   Urinalysis:  Recent Labs Lab 11/05/13 2149 11/06/13 0952  COLORURINE YELLOW YELLOW  LABSPEC 1.019 1.019  PHURINE 5.5 5.0  GLUCOSEU NEGATIVE NEGATIVE  HGBUR MODERATE* NEGATIVE  BILIRUBINUR NEGATIVE NEGATIVE  KETONESUR NEGATIVE NEGATIVE  PROTEINUR NEGATIVE 30*  UROBILINOGEN 0.2 0.2  NITRITE NEGATIVE NEGATIVE  LEUKOCYTESUR MODERATE* NEGATIVE   Medications: I have reviewed the patient's current medications. Scheduled Meds: . amoxicillin-clavulanate  1 tablet Oral Q12H  . gabapentin  400 mg Oral BID  . insulin aspart  0-9 Units Subcutaneous TID AC & HS  . ipratropium-albuterol  3 mL Nebulization Q4H  . sodium chloride  3 mL Intravenous Q12H   Continuous Infusions:  PRN Meds:.ipratropium-albuterol, morphine injection  Assessment/Plan: Elizabeth Holder is a 80 y.o. female w/ PMHx of COPD, PAD, neuropathy, CKD stage III, and legal blindness, admitted for COPD exacerbation, found to have new RLL infiltrate, suspected CAP vs aspiration pneumonia.  CAP vs aspiration pneumonia- Improved. Still with dyspnea, but satting well on 2L via [place]. - Continue Augmentin 500-125 bid -Moprhine 1 mg q1h prn for air hunger - Blood cultures negative to date. - Duonebs q4h + q2h prn   COPD exacerbation- Likely contributing to hypoxia initially. Currently satting well on O2 via [place]. - Duonebs- q4h + q2h prn  Asymptomatic Bacteriuria- Patient denies symptoms of dysuria, urgency, and frequency. UA performed on admission, showed moderate leukocytes. Urine culture returned positive for >100,000 colonies E. Coli, pan-sensitive.  - Given  absence of symptoms, no further workup  Generalized Weakness- Likely due to diarrhea, as per notes from [facility], pt has chronic diarrhea, but pt says this has resolved. - Continue dysphagia 3 diet.  Diabetic Polyneuropathy- Not on any hypoglycemic agent.  - Continue Gabapentin 400 mg bid.   DVT PPx- SCD's  CODE- DNR/DNI, as per pts wishes.   Dispo: Discharge to [facility] today via [facility].  The patient does have a current PCP Janifer Adie, MD) and does need an [facility] hospital follow-up appointment after discharge.  The patient does not have transportation limitations that hinder transportation to clinic appointments.  .Services Needed at time of discharge: Y = Yes, Blank = No PT:   OT:   RN:   Equipment:   Other:     LOS: 4 days   Corky Sox, MD 11/09/2013, 8:22 AM

## 2013-11-09 NOTE — Progress Notes (Signed)
Pt to be d/c today to [facility].   Pt and family agreeable. Confirmed plans with facility.  Plan transfer via EMS.    [facility]  4N 1-16;  [phone] Phone: [phone]

## 2013-11-09 NOTE — Discharge Summary (Signed)
Name: Elizabeth Holder MRN: [id number] DOB: May 13, 1917 80 y.o. PCP: Janifer Adie, MD  Date of Admission: 11/05/2013  4:55 PM Date of Discharge: 11/09/2013 Attending Physician: Karren Cobble, MD  Discharge Diagnosis: 1. Aspiration Pneumonia 2. COPD exacerbation 3. Weakness 4. Asymptomatic Bacteriuria  Discharge Medications:   Medication List         amoxicillin-clavulanate 500-125 MG per tablet  Commonly known as:  AUGMENTIN  Take 1 tablet (500 mg total) by mouth every 12 (twelve) hours.     Calcium Carbonate-Vit D-Min 600-400 MG-UNIT Tabs  Take 1 tablet by mouth daily.     ipratropium-albuterol 0.5-2.5 (3) MG/3ML Soln  Commonly known as:  DUONEB  Take 3 mLs by nebulization every 4 (four) hours as needed.     multivitamin with minerals tablet  Take 1 tablet by mouth daily.        Disposition and follow-up:   Elizabeth Holder was discharged from [facility] in Stable condition.  At the hospital follow up visit please address:  1.  SOB, air hunger, appetite. Patient's wishes and comfort are primary issues, given DNR/DNI status.  2.  Labs / imaging needed at time of follow-up: none  3.  Pending labs/ test needing follow-up: none  Follow-up Appointments: Follow-up Information   Follow up with [facility].      Discharge Instructions: Discharge Orders   Future Orders Complete By Expires   Call MD for:  difficulty breathing, headache or visual disturbances  As directed    Call MD for:  temperature >100.4  As directed      Procedures Performed:  Dg Chest 2 View  11/01/2013   CLINICAL DATA:  Cough and shortness of breath.  EXAM: CHEST  2 VIEW  COMPARISON:  None.  FINDINGS: The heart size and mediastinal contours are normal. The lungs are mildly hyperinflated with central airway thickening and diffuse interstitial prominence, likely representing chronic obstructive pulmonary disease. No definite superimposed edema or confluent airspace  opacity is seen. There is no pleural effusion. The bones are demineralized without evidence of acute fracture.  IMPRESSION: Probable chronic obstructive pulmonary disease. No acute findings demonstrated.   Electronically Signed   By: Camie Patience M.D.   On: 11/01/2013 12:11   Dg Chest Port 1 View  11/06/2013   CLINICAL DATA:  Respiratory distress  EXAM: PORTABLE CHEST - 1 VIEW  COMPARISON:  11/01/2013  FINDINGS: Cardiac shadow is stable. The lungs are well aerated bilaterally. New patchy infiltrative density is noted in the right mid and lower lung field. No pneumothorax or pleural effusion is seen. The left lung remains clear. Calcified granuloma is again noted in the left lung base.  IMPRESSION: New patchy infiltrate in the right mid and lower lung field. Continued followup is recommended.   Electronically Signed   By: Inez Catalina M.D.   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 Presented to the Emergency department with c/o of SOB and generalised weakness.  Generalised weakness started about 5 days ago, pt said she could not get up. She had diarrhea lasting about 1-2 days, and was treated with imodium prescribed by the [facility] doctor, now having normal bowel movements today. Pt endorsed Reduced appetite, No hx of vomiting or abdominal pain.  Shortness of breath started on 4 days ago, on friday, and  has progressively gotten worse, with associated cough, worsening since onset but not productive of sputum. Pts thinks her daughter had a cough, before pt herself got ill, but No fever, nebs at home did not improve symptoms, no chest pain, no PND, Patient uses 1 pillow to sleep over the past year, no pedal edema, no leg pain, . Previously smoked 1-2 packs per day, in her 20s- 40s, does not use O2 at home.  Pt was seen by a doctor at the [facility] center on Friday- with same  complaints where a flu swab was done and was negative, CBC, UA and chest xray, were unremarkable, with a Bmet revealing worsening renal function, with increased Ca, pt was discharged home in the care of her granddaughter to increase oral hydration. Pt was then seen again today, and decline in clinical status was noted with dehydration, pt was given 1L of D5N/s for IV resuscitation and to send the pt to the hospital for admission.   Hospital Course by problem list:   1. Aspiration Pneumonia- Patient presented w/ findings suggestive of COPD exacerbation as described below. On the AM of 11/06/13, patient became hypoxic, SpO2 decreased to 80's on 6L O2 via [place], accompanied by tachypnea, tachycardia, agitation and air hunger, suggesting hypercapneic respiratory failure. Lung exam w/ decreased air entry diffusely. Rectal temperature ~101 F. On admission, WBC's 6.2, increased to 24.2 (started on Prednisone overnight for COPD, however, this unlikely to cause this significant a rise in [facility]). Placed on high flow O2 via mask, w/ increase in SpO2, however, patient still was very agitated and tachypneic. Given 1 mg Morphine, w/ minimal relief of agitation. Portable CXR performed, showed new RLL infiltrate. Given duoneb treatment still with minimal relief. ABG performed, showed 7.17/80/255/27.5/100%, suggesting significant respiratory acidosis. Placed on BiPAP w/ clinical improvement; patient appeared more comfortable on exam. Upgraded to stepdown. Seen later in the afternoon, comfortable on O2 via [place]. Satting 88-92%. Patient was started on Azithromycin + Zosyn  Blood cultures x2 ordered, negative to date. Leukocytosis decreased to normal levels the next day, with significant improvement in clinical status. SLP evaluation performed, showed intermittent aspiration 2/2 dyspnea on . However, given patient's wishes, diet started. She was continued on Duonebs q4h + q2h prn and given Morphine 1 mg qh1 prn for air hunger.  Transferred out of the stepdown 11/08/13 and changed antibiotics to Augmentin 500-125 bid to be continued until 11/15/13 (total of 10 days). OF NOTE; patient is DNR/DNI, to be discharged to [facility] the [facility] of [facility].   2. COPD exacerbation- Patient presented w/ SOB, cough, and hypoxia at home, w/ h/o COPD, possibly contributed to by viral URI. Previous CXR and CBC not initially suggestive of active infective/bacterial process. Started on Prednisone 40mg  daily on admission, held the next day given evidence of aspiration pneumonia. Continued on Duonebs q4h + q2h prn. Required BiPAP on 11/06/13, as discussed above.   3. Weakness- Likely due to diarrhea, as per notes from [facility], pt has chronic diarrhea, but pt said this has resolved. Also claims to have a poor diet. Kept NPO at first given respiratory status, continued on D5 NS @ 75 ml/hr, discontinued once able to eat food.   4. Asymptomatic Bacteriuria- Patient denies symptoms of dysuria, urgency, and frequency. UA performed on admission, showed moderate leukocytes. Urine culture returned positive for >100,000 colonies E. Coli, pan-sensitive. Given absence of symptoms, no further workup.  Discharge Vitals:   BP 137/51  Pulse 93  Temp(Src) 97.6 F (36.4 C) (Oral)  Resp 20  Ht 5\' 3"  (1.6 m)  Wt 112 lb 14 oz (51.2 kg)  BMI 20.00 kg/m2  SpO2 95%  Discharge Labs:  Results for orders placed during the hospital encounter of 11/05/13 (from the past 24 hour(s))  GLUCOSE, CAPILLARY     Status: Abnormal   Collection Time    11/08/13 12:31 PM      Result Value Ref Range   Glucose-Capillary 108 (*) 70 - 99 mg/dL   Comment 1 Notify RN    GLUCOSE, CAPILLARY     Status: Abnormal   Collection Time    11/08/13  4:42 PM      Result Value Ref Range   Glucose-Capillary 110 (*) 70 - 99 mg/dL  GLUCOSE, CAPILLARY     Status: Abnormal   Collection Time    11/08/13  9:32 PM      Result Value Ref Range   Glucose-Capillary 112 (*) 70 - 99 mg/dL  GLUCOSE,  CAPILLARY     Status: Abnormal   Collection Time    11/09/13  8:10 AM      Result Value Ref Range   Glucose-Capillary 117 (*) 70 - 99 mg/dL    Signed: Corky Sox, MD 11/09/2013, 11:31 AM   Time Spent on Discharge: 35 minutes Services Ordered on Discharge: none Equipment Ordered on Discharge: none

## 2013-11-09 NOTE — Progress Notes (Signed)
CSW spoke with Raquel Sarna Scarce [phone] [facility] for d/c planning. Raquel Sarna stated that she spoke with [facility] and they are willing to accept Pt today. CSW will send Pt d/c summary and AVS to facility and contact for verification.   CSW will continue to follow Pt for d/c planning.    [facility]  [address];  [phone] Phone: [phone]

## 2013-11-09 NOTE — Progress Notes (Signed)
CSW left a message for admission coordinator and is awaiting a call back to see if Pt can transport to facility today.   CSW will continue to follow Pt for d/c planning.    [facility]  [address];  [phone] Phone: [phone]

## 2013-11-09 NOTE — Progress Notes (Signed)
  Date: 11/09/2013  Patient name: Elizabeth Holder  Medical record number: [id number]  Date of birth: November 20, 1916   This patient has been seen and the plan of care was discussed with the house staff. Please see their note for complete details. I concur with their findings with the following additions/corrections: [name] c/o dyspnea. She is using accessory muscles to breathe and is unable to speak in full sentences. She is DNR but wants ABX to tx the PNA and comfort measures. To go to SNF today with [facility]. One dose MSO4 now to help with dyspnea.   Bartholomew Crews, MD 11/09/2013, 11:28 AM

## 2013-11-09 NOTE — Discharge Instructions (Signed)
Aspiration Pneumonia °Aspiration pneumonia is an infection in your lungs. It occurs when food, liquid, or stomach contents (vomit) are inhaled (aspirated) into your lungs. When these things get into your lungs, swelling (inflammation) and infection can occur. This can make it difficult for you to breathe. Aspiration pneumonia is a serious condition and can be life threatening. °RISK FACTORS °Aspiration pneumonia is more likely to occur when a person's cough (gag) reflex or ability to swallow has been decreased. Some things that can do this include:  °· Having a brain injury or disease, such as stroke, seizures, Parkinson disease, dementia, or amyotrophic lateral sclerosis (ALS).   °· Being given general anesthetic for procedures.   °· Being in a coma (unconscious).   °· Having a narrowing of the tube that carries food to the stomach (esophagus).   °· Drinking too much alcohol. If a person passes out and vomits, vomit can be swallowed into the lungs.   °· Taking certain medicines, such as tranquilizers or sedatives.   °SIGNS AND SYMPTOMS  °· Coughing after swallowing food or liquids.   °· Breathing problems, such as wheezing or shortness of breath.   °· Bluish skin. This can be caused by lack of oxygen.   °· Coughing up food or mucus. The mucus might contain blood, greenish material, or yellowish-white fluid (pus).   °· Fever.   °· Chest pain.   °· Being more tired than usual (fatigue).   °· Sweating more than usual.   °· Bad breath.   °DIAGNOSIS  °A physical exam will be done. During the exam, the health care provider will listen to your lungs with a stethoscope to check for:  °· Crackling sounds in the lungs. °· Decreased breath sounds. °· A rapid heartbeat. °Various tests may be ordered. These may include:  °· Chest X-ray.   °· CT scan.   °· Swallowing study. This test looks at how food is swallowed and whether it goes into your breathing tube (trachea) or food pipe (esophagus).   °· Sputum culture. Saliva and  mucus (sputum) are collected from the lungs or the tubes that carry air to the lungs (bronchi). The sputum is then tested for bacteria.   °· Bronchoscopy. This test uses a flexible tube (bronchoscope) to see inside the lungs. °TREATMENT  °Treatment will usually include antibiotic medicines. Other medicines may also be used to reduce fever or pain. You may need to be treated in the hospital. In the hospital, your breathing will be carefully monitored. Depending on how well you are breathing, you may need to be given oxygen, or you may need breathing support from a breathing machine (ventilator). For people who fail a swallowing study, a feeding tube might be placed in the stomach, or they may be asked to avoid certain food textures or liquids when they eat. °HOME CARE INSTRUCTIONS  °· Carefully follow any special eating instructions you were given, such as avoiding certain food textures or thickening liquids. This reduces the risk of developing aspiration pneumonia again.  °· Only take over-the-counter or prescription medicines as directed by your health care provider. Follow the directions carefully.   °· If you were prescribed antibiotics, take them as directed. Finish them even if you start to feel better.   °· Rest as instructed by your health care provider.   °· Keep all follow-up appointments with your health care provider.   °SEEK MEDICAL CARE IF:  °· You develop worsening shortness of breath, wheezing, or difficulty breathing.   °· You develop a fever.   °· You have chest pain.   °MAKE SURE YOU:  °· Understand these instructions. °· Will watch your   condition. °· Will get help right away if you are not doing well or get worse. °Document Released: 07/11/2009 Document Revised: 05/16/2013 Document Reviewed: 03/01/2013 °[employer]® Patient Information ©2014 [employer]. ° °

## 2013-11-12 LAB — CULTURE, BLOOD (ROUTINE X 2)
CULTURE: NO GROWTH
Culture: NO GROWTH

## 2014-01-24 ENCOUNTER — Other Ambulatory Visit: Payer: Self-pay | Admitting: *Deleted

## 2014-01-24 ENCOUNTER — Ambulatory Visit
Admission: RE | Admit: 2014-01-24 | Discharge: 2014-01-24 | Disposition: A | Discharge status: Home / Self Care | Payer: Medicare (Managed Care) | Source: Ambulatory Visit | Attending: *Deleted | Admitting: *Deleted

## 2014-01-24 DIAGNOSIS — S99922A Unspecified injury of left foot, initial encounter: Secondary | ICD-10-CM

## 2014-09-05 ENCOUNTER — Encounter (HOSPITAL_COMMUNITY): Payer: Self-pay | Admitting: Vascular Surgery

## 2014-12-12 ENCOUNTER — Ambulatory Visit
Admission: RE | Admit: 2014-12-12 | Discharge: 2014-12-12 | Disposition: A | Discharge status: Home / Self Care | Payer: Medicare (Managed Care) | Source: Ambulatory Visit | Attending: Family Medicine | Admitting: Family Medicine

## 2014-12-12 ENCOUNTER — Other Ambulatory Visit: Payer: Self-pay | Admitting: Family Medicine

## 2014-12-12 DIAGNOSIS — J441 Chronic obstructive pulmonary disease with (acute) exacerbation: Secondary | ICD-10-CM

## 2015-02-06 ENCOUNTER — Other Ambulatory Visit: Payer: Self-pay | Admitting: Family Medicine

## 2015-02-06 ENCOUNTER — Ambulatory Visit
Admission: RE | Admit: 2015-02-06 | Discharge: 2015-02-06 | Disposition: A | Discharge status: Home / Self Care | Payer: No Typology Code available for payment source | Source: Ambulatory Visit | Attending: Family Medicine | Admitting: Family Medicine

## 2015-02-06 DIAGNOSIS — R52 Pain, unspecified: Secondary | ICD-10-CM

## 2015-04-24 ENCOUNTER — Other Ambulatory Visit (HOSPITAL_COMMUNITY): Payer: Self-pay | Admitting: Family Medicine

## 2015-04-24 DIAGNOSIS — R011 Cardiac murmur, unspecified: Secondary | ICD-10-CM

## 2015-05-01 ENCOUNTER — Ambulatory Visit (HOSPITAL_COMMUNITY)
Admission: RE | Admit: 2015-05-01 | Discharge: 2015-05-01 | Disposition: A | Discharge status: Home / Self Care | Payer: Medicare (Managed Care) | Source: Ambulatory Visit | Attending: Family Medicine | Admitting: Family Medicine

## 2015-05-01 DIAGNOSIS — I059 Rheumatic mitral valve disease, unspecified: Secondary | ICD-10-CM | POA: Insufficient documentation

## 2015-05-01 DIAGNOSIS — I340 Nonrheumatic mitral (valve) insufficiency: Secondary | ICD-10-CM | POA: Insufficient documentation

## 2015-05-01 DIAGNOSIS — I352 Nonrheumatic aortic (valve) stenosis with insufficiency: Secondary | ICD-10-CM | POA: Insufficient documentation

## 2015-05-01 DIAGNOSIS — R011 Cardiac murmur, unspecified: Secondary | ICD-10-CM

## 2015-05-01 NOTE — Progress Notes (Signed)
  Echocardiogram 2D Echocardiogram has been performed.  Jennette Dubin 05/01/2015, 3:06 PM

## 2015-06-12 ENCOUNTER — Other Ambulatory Visit: Payer: Self-pay | Admitting: Internal Medicine

## 2015-06-12 ENCOUNTER — Ambulatory Visit
Admission: RE | Admit: 2015-06-12 | Discharge: 2015-06-12 | Disposition: A | Discharge status: Home / Self Care | Payer: No Typology Code available for payment source | Source: Ambulatory Visit | Attending: Internal Medicine | Admitting: Internal Medicine

## 2015-06-12 DIAGNOSIS — R053 Chronic cough: Secondary | ICD-10-CM

## 2015-06-12 DIAGNOSIS — R0989 Other specified symptoms and signs involving the circulatory and respiratory systems: Secondary | ICD-10-CM

## 2015-06-12 DIAGNOSIS — R05 Cough: Secondary | ICD-10-CM

## 2015-06-18 ENCOUNTER — Other Ambulatory Visit: Payer: Self-pay | Admitting: Family Medicine

## 2015-06-18 DIAGNOSIS — R053 Chronic cough: Secondary | ICD-10-CM

## 2015-06-18 DIAGNOSIS — R911 Solitary pulmonary nodule: Secondary | ICD-10-CM

## 2015-06-18 DIAGNOSIS — R05 Cough: Secondary | ICD-10-CM

## 2015-06-24 ENCOUNTER — Other Ambulatory Visit: Payer: No Typology Code available for payment source

## 2016-03-29 ENCOUNTER — Other Ambulatory Visit (HOSPITAL_COMMUNITY): Payer: Self-pay | Admitting: *Deleted

## 2016-03-31 ENCOUNTER — Ambulatory Visit (HOSPITAL_COMMUNITY)
Admission: RE | Admit: 2016-03-31 | Discharge: 2016-03-31 | Disposition: A | Discharge status: Home / Self Care | Payer: Medicare (Managed Care) | Source: Ambulatory Visit | Attending: Family Medicine | Admitting: Family Medicine

## 2016-03-31 DIAGNOSIS — D696 Thrombocytopenia, unspecified: Secondary | ICD-10-CM | POA: Diagnosis not present

## 2016-03-31 DIAGNOSIS — D509 Iron deficiency anemia, unspecified: Secondary | ICD-10-CM | POA: Insufficient documentation

## 2016-03-31 DIAGNOSIS — C8580 Other specified types of non-Hodgkin lymphoma, unspecified site: Secondary | ICD-10-CM | POA: Insufficient documentation

## 2016-03-31 MED ORDER — SODIUM CHLORIDE 0.9 % IV SOLN
510.0000 mg | INTRAVENOUS | Status: DC
  Administered 2016-03-31: 510 mg via INTRAVENOUS
  Filled 2016-03-31: qty 17

## 2016-04-14 ENCOUNTER — Ambulatory Visit (HOSPITAL_COMMUNITY)
Admission: RE | Admit: 2016-04-14 | Discharge: 2016-04-14 | Disposition: A | Discharge status: Home / Self Care | Payer: Medicare (Managed Care) | Source: Ambulatory Visit | Attending: Family Medicine | Admitting: Family Medicine

## 2016-04-14 DIAGNOSIS — D509 Iron deficiency anemia, unspecified: Secondary | ICD-10-CM | POA: Diagnosis not present

## 2016-04-14 DIAGNOSIS — D696 Thrombocytopenia, unspecified: Secondary | ICD-10-CM | POA: Insufficient documentation

## 2016-04-14 MED ORDER — SODIUM CHLORIDE 0.9 % IV SOLN
510.0000 mg | INTRAVENOUS | Status: AC
  Administered 2016-04-14: 510 mg via INTRAVENOUS
  Filled 2016-04-14: qty 17

## 2016-08-03 ENCOUNTER — Ambulatory Visit
Admission: RE | Admit: 2016-08-03 | Discharge: 2016-08-03 | Disposition: A | Discharge status: Home / Self Care | Payer: No Typology Code available for payment source | Source: Ambulatory Visit | Attending: Nurse Practitioner | Admitting: Nurse Practitioner

## 2016-08-03 ENCOUNTER — Other Ambulatory Visit: Payer: Self-pay | Admitting: Nurse Practitioner

## 2016-08-03 DIAGNOSIS — R0789 Other chest pain: Secondary | ICD-10-CM

## 2016-08-25 IMAGING — CR DG SACRUM/COCCYX 2+V
3 series · 3 of 3 positions shown · non-contrast
Comparison: No prior.

CLINICAL DATA: Back pain.  Initial evaluation.  No known injury.

EXAM:
SACRUM AND COCCYX - 2+ VIEW

[t sacrum a.p.]
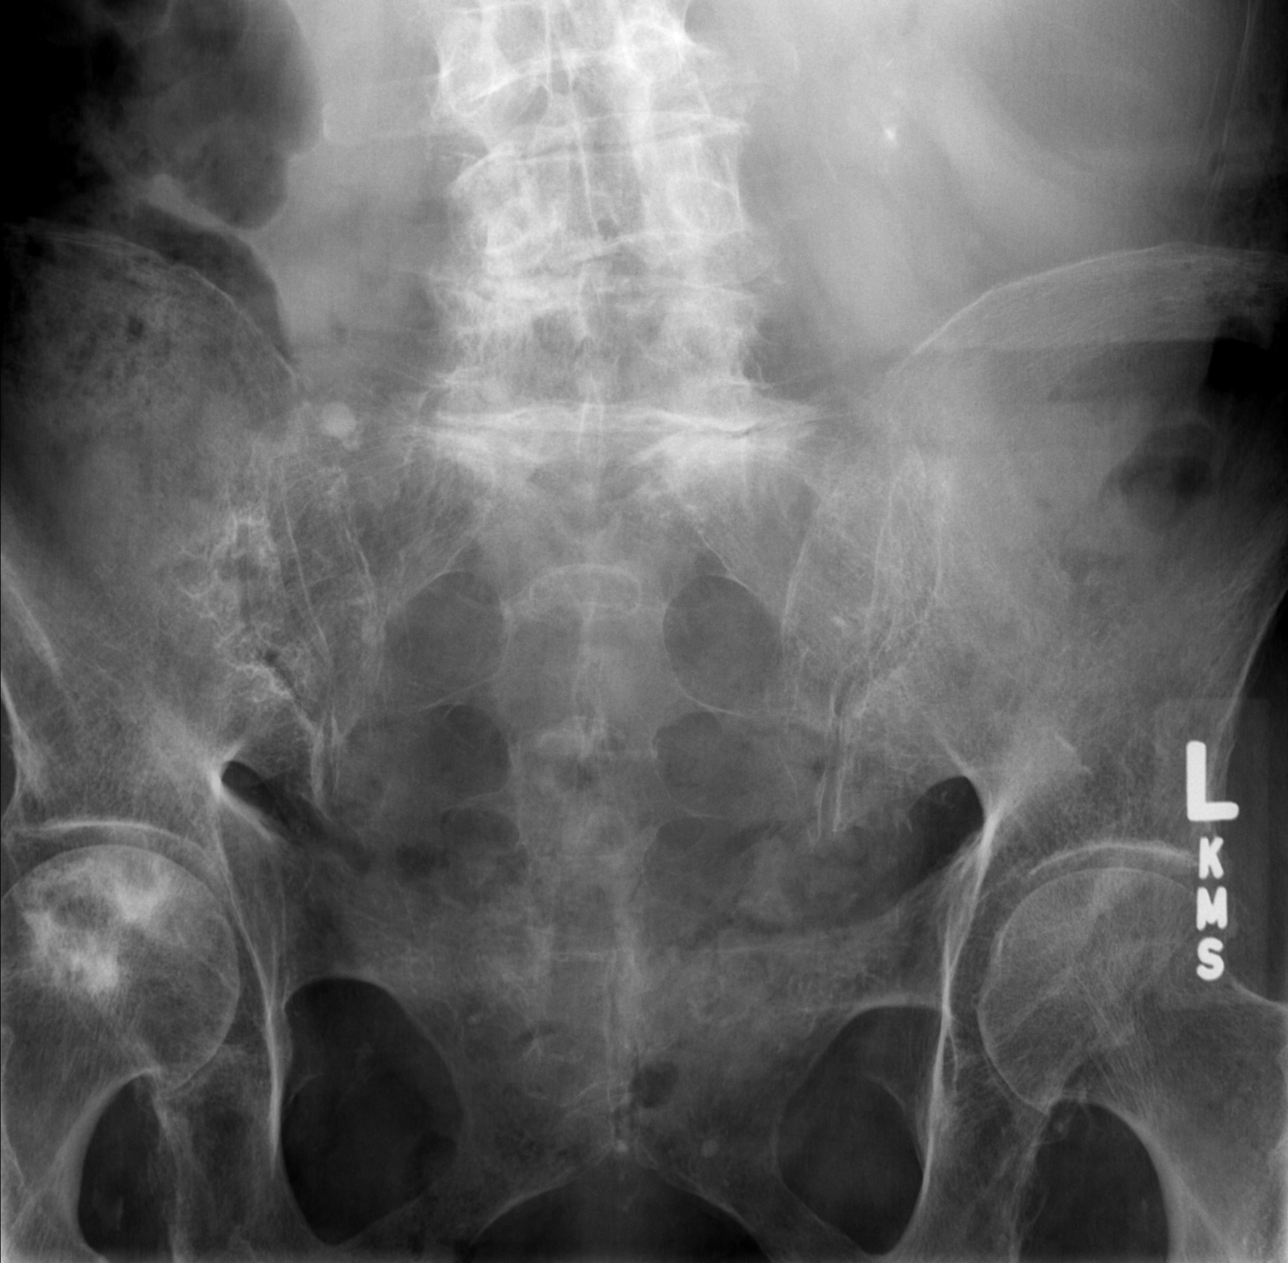

[t coccyx a.p.]
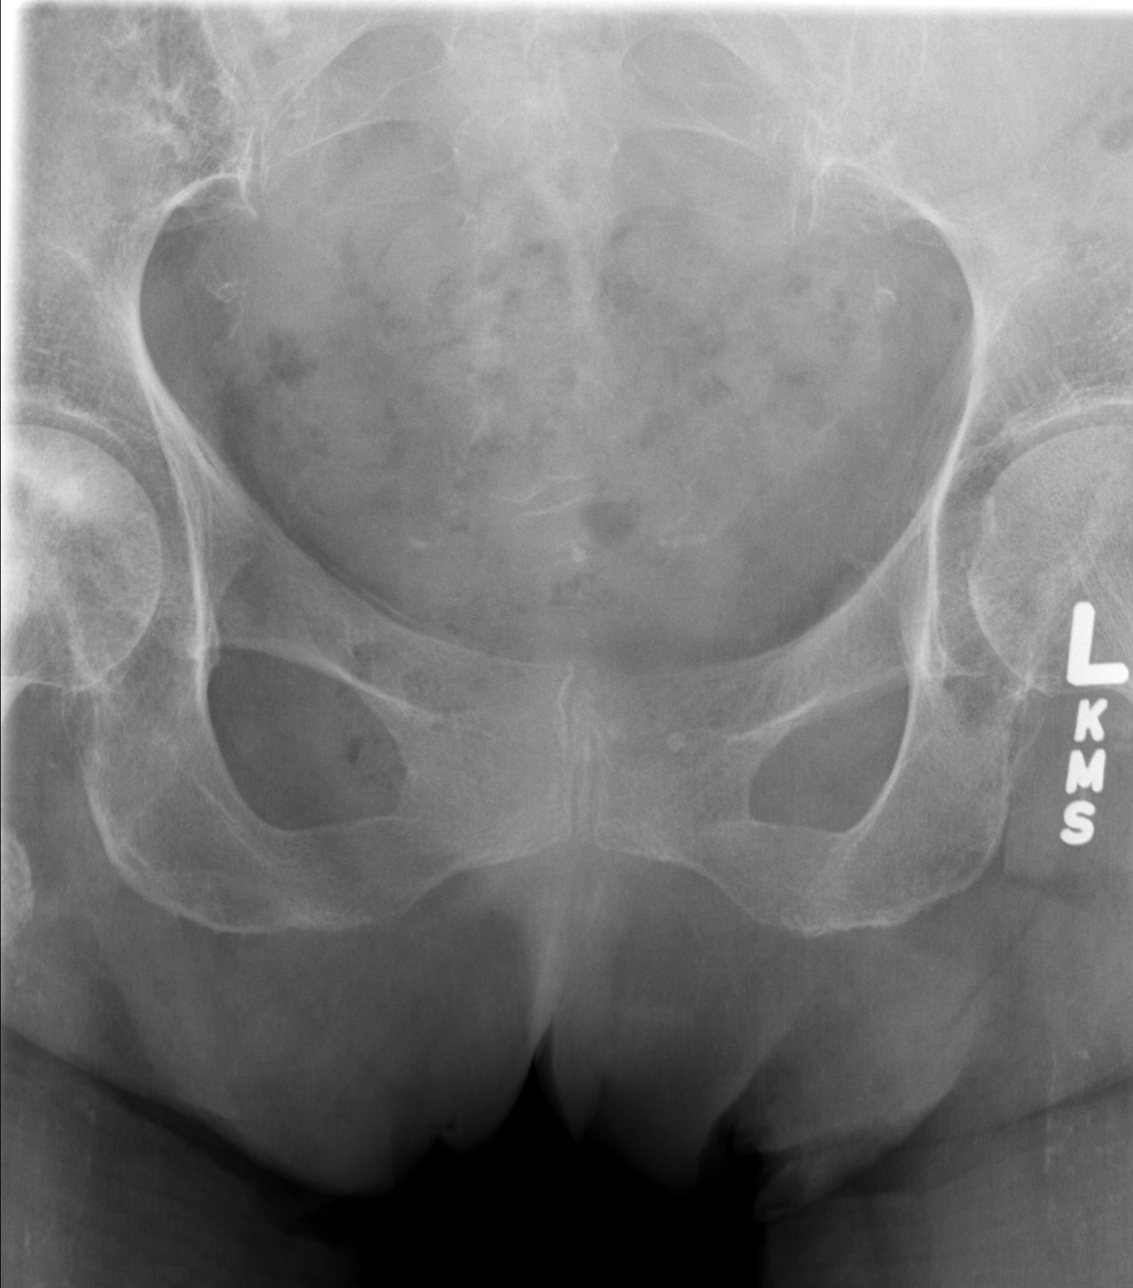

[t sacrum lat]
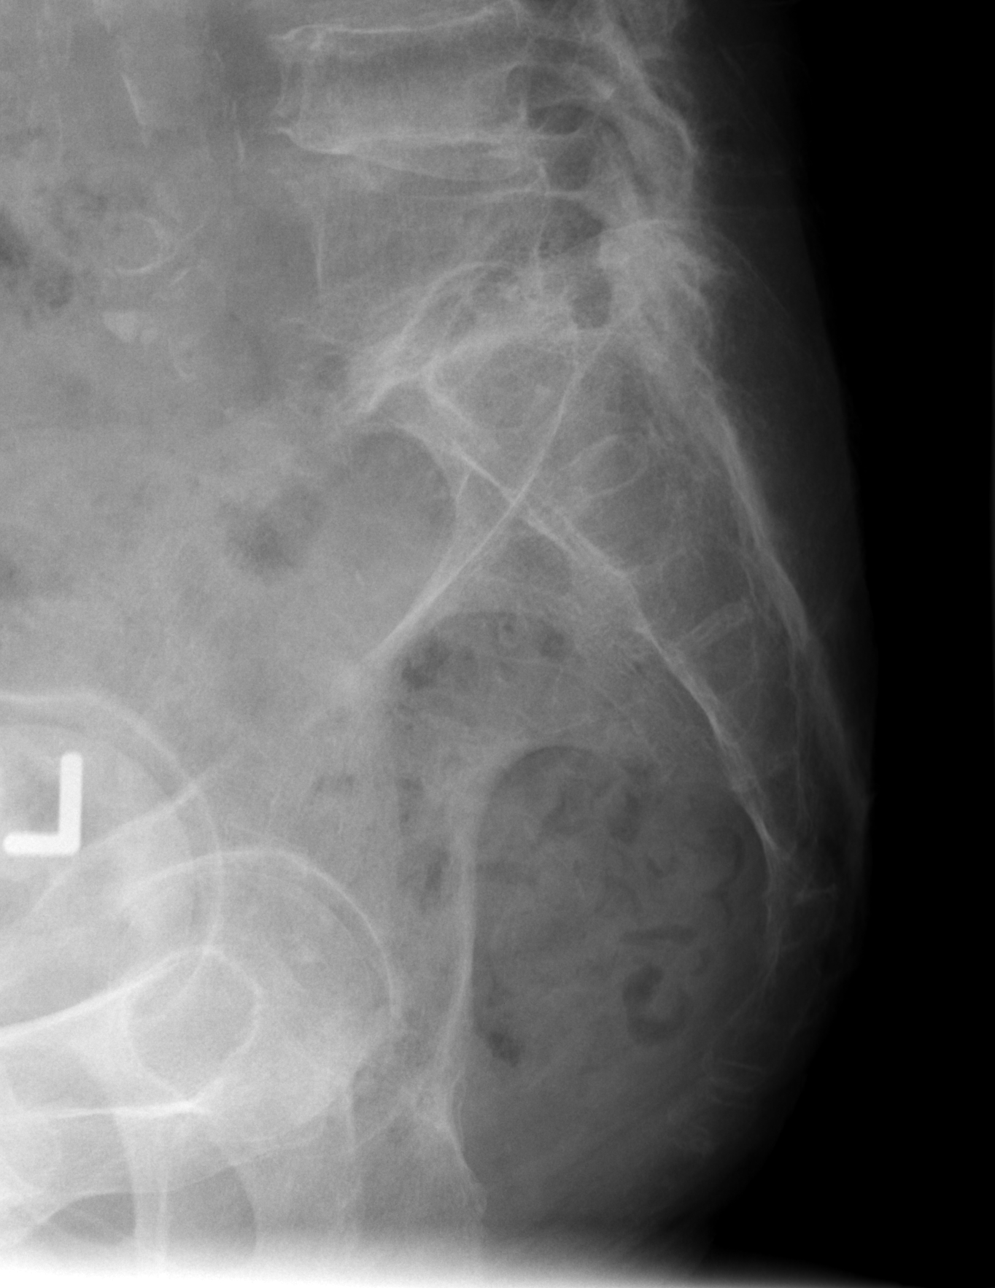

[3 of 3 positions shown; findings below may reference images not displayed]

FINDINGS: Degenerative changes lumbar spine and both hips. Sclerotic changes
in the right femoral head. This is consistent with avascular
necrosis. L5 mild compression fracture. Pelvic calcifications
consistent phleboliths. Ureteral stones cannot be excluded. Vascular
calcification noted.
IMPRESSION: 1. No acute abnormality.
2. Sclerotic changes right femoral head. Avascular necrosis cannot
be excluded.
3. L5 compression fracture, age undetermined.

## 2016-08-27 DEATH — deceased

## 2018-02-20 IMAGING — CR DG CHEST 2V
2 series · 2 of 2 positions shown · non-contrast
Comparison: 06/12/2015 chest radiograph.

CLINICAL DATA: Chest pain

EXAM:
CHEST  2 VIEW

[w chest lat]
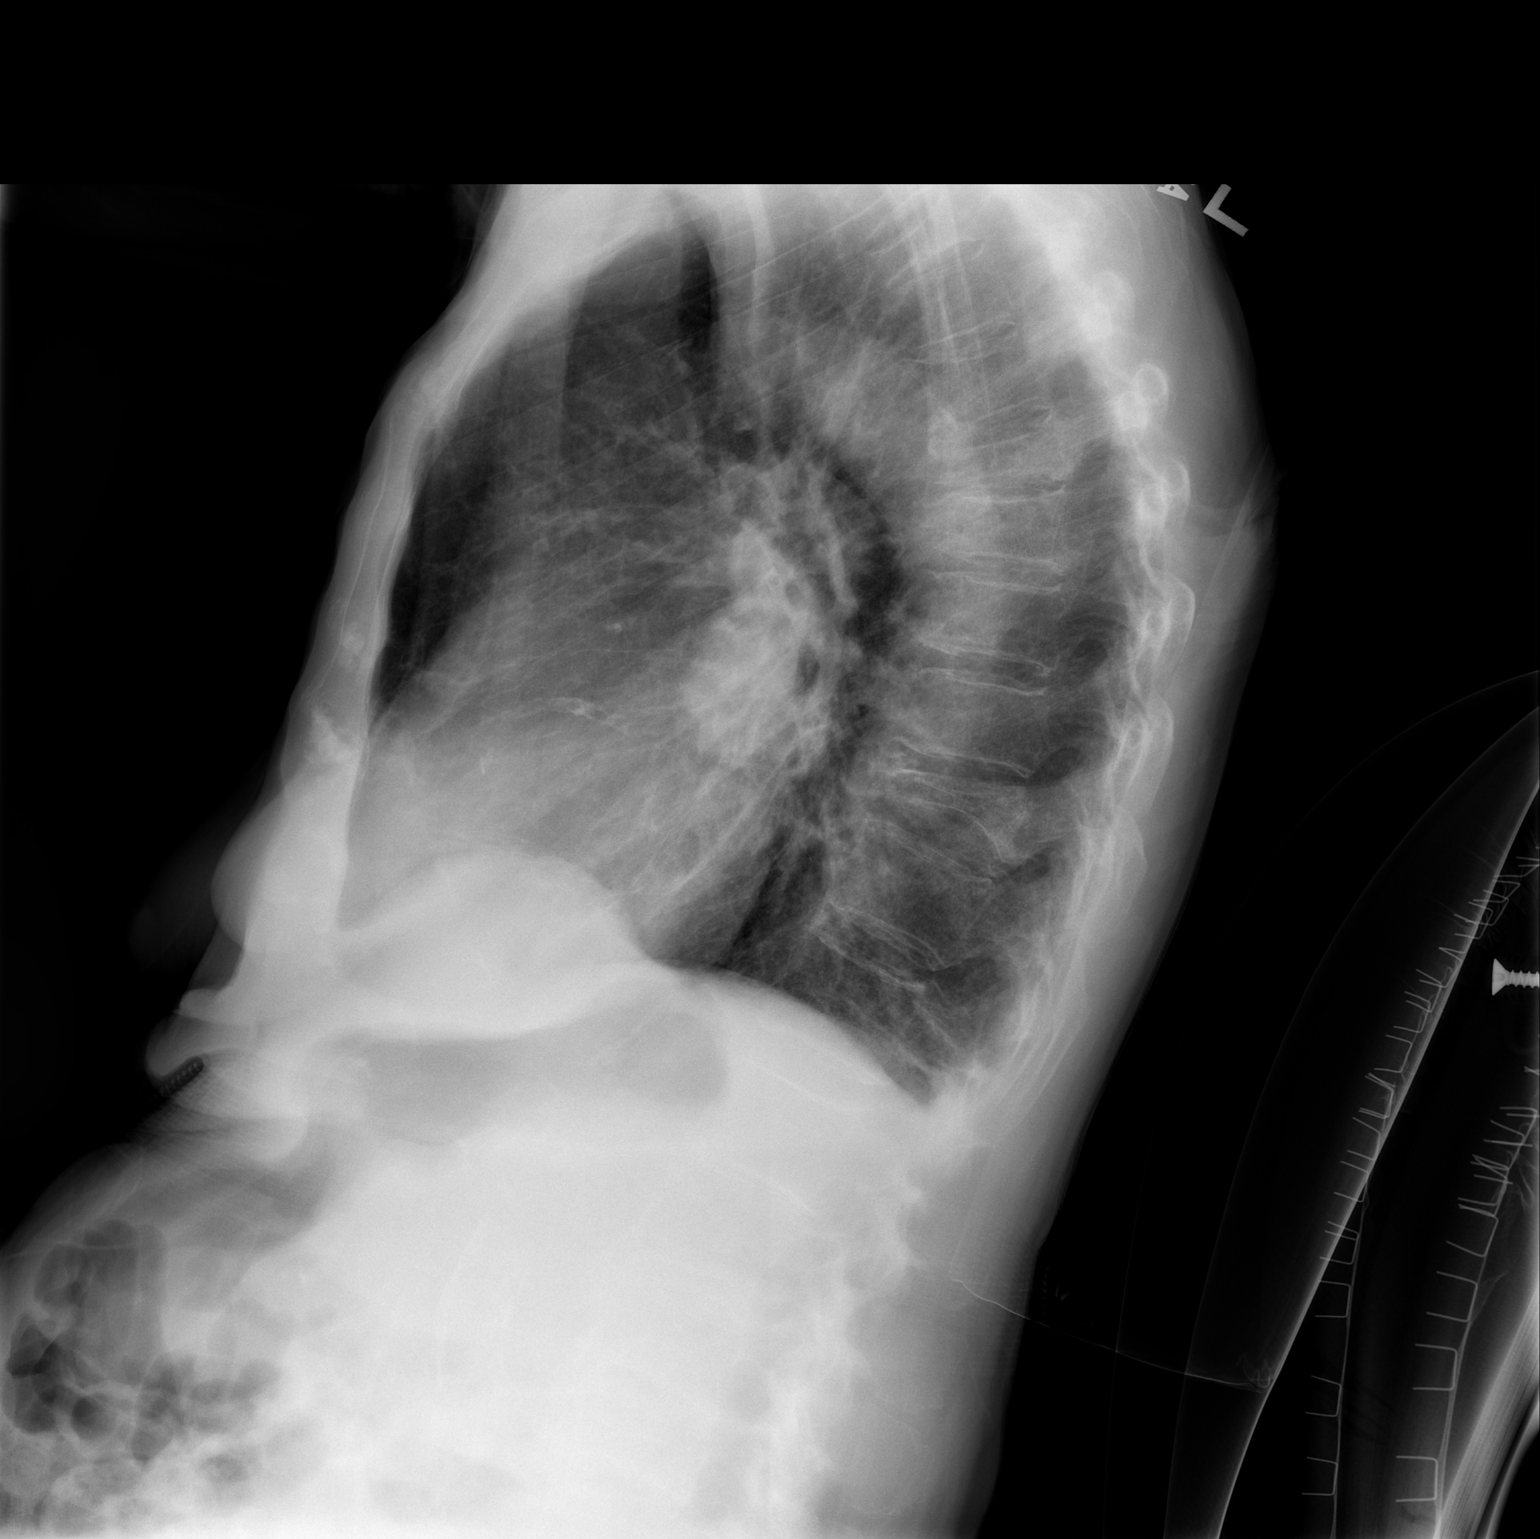

[w chest ap]
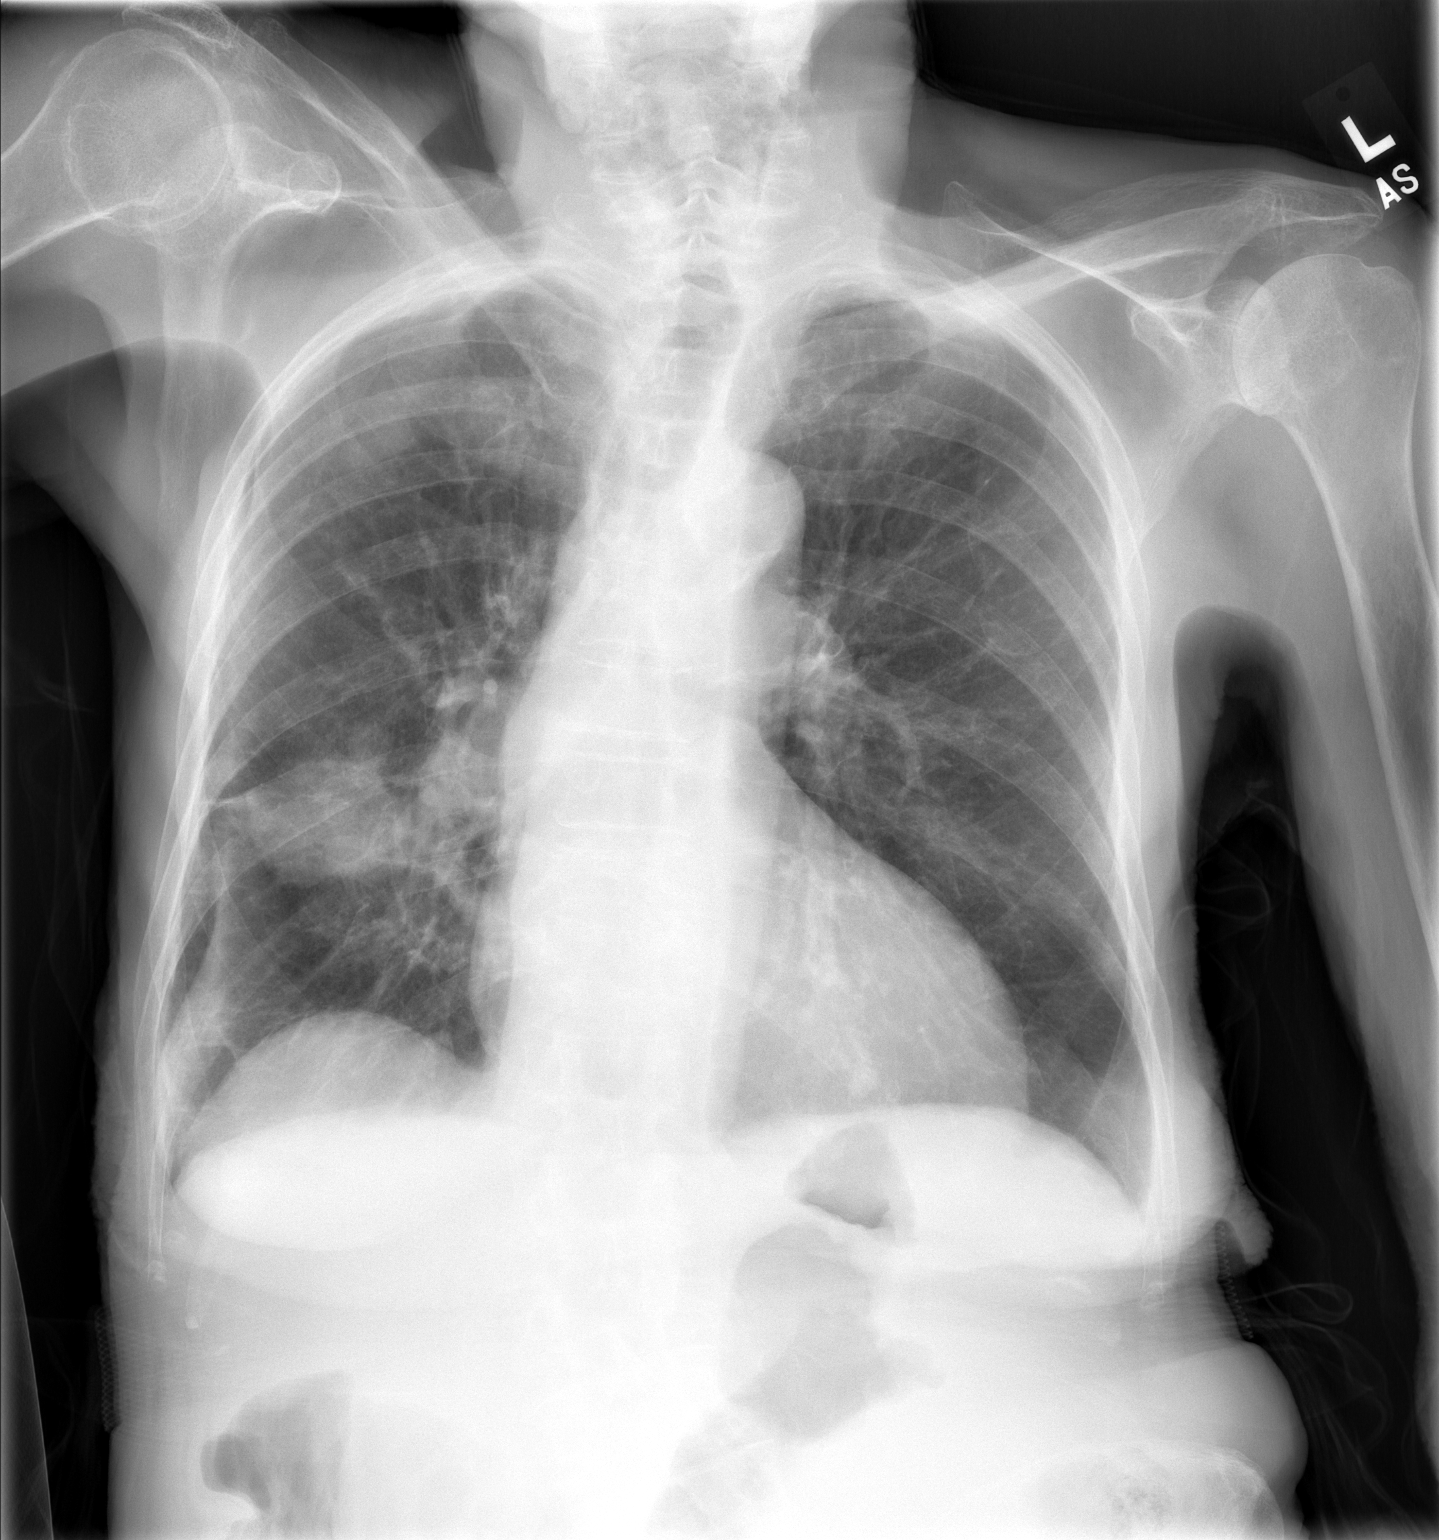

[2 of 2 positions shown; findings below may reference images not displayed]

FINDINGS: Stable cardiomediastinal silhouette with normal heart size and
aortic atherosclerosis. No pneumothorax. No pleural effusion. There
is a 4.5 cm right mid lung masslike opacity, increased from 1.4 cm
on 06/12/2015. No pulmonary edema. There are two new severe thoracic
vertebral compression fractures in the mid and lower thoracic spine.
IMPRESSION: 1. Right mid lung 4.5 cm masslike opacity, significantly increased
in size since 06/12/2015. Chest CT with IV contrast is advised to
further characterize this possible primary bronchogenic carcinoma,
as clinically warranted.
2. Two new severe thoracic vertebral compression fractures in the
mid and lower thoracic spine.
3. Aortic atherosclerosis.
